# Patient Record
Sex: Female | Born: 1935 | Race: White | Hispanic: No | Marital: Married | Smoking: Former smoker
Health system: Southern US, Community
[De-identification: ages and names within clinical notes are randomized; demographics above are authoritative.]

## PROBLEM LIST (undated history)

## (undated) DIAGNOSIS — Z923 Personal history of irradiation: Secondary | ICD-10-CM

## (undated) DIAGNOSIS — C50919 Malignant neoplasm of unspecified site of unspecified female breast: Secondary | ICD-10-CM

## (undated) DIAGNOSIS — R519 Headache, unspecified: Secondary | ICD-10-CM

## (undated) DIAGNOSIS — R51 Headache: Secondary | ICD-10-CM

## (undated) DIAGNOSIS — I639 Cerebral infarction, unspecified: Secondary | ICD-10-CM

## (undated) DIAGNOSIS — K589 Irritable bowel syndrome without diarrhea: Secondary | ICD-10-CM

## (undated) DIAGNOSIS — C801 Malignant (primary) neoplasm, unspecified: Secondary | ICD-10-CM

## (undated) DIAGNOSIS — I4891 Unspecified atrial fibrillation: Secondary | ICD-10-CM

## (undated) DIAGNOSIS — E039 Hypothyroidism, unspecified: Secondary | ICD-10-CM

---

## 2005-03-15 ENCOUNTER — Ambulatory Visit: Payer: Self-pay | Admitting: Internal Medicine

## 2006-08-07 ENCOUNTER — Ambulatory Visit: Payer: Self-pay | Admitting: Internal Medicine

## 2006-08-15 ENCOUNTER — Ambulatory Visit: Payer: Self-pay | Admitting: Internal Medicine

## 2006-08-27 ENCOUNTER — Ambulatory Visit: Payer: Self-pay | Admitting: Internal Medicine

## 2006-09-07 HISTORY — PX: BREAST BIOPSY: SHX20

## 2006-10-03 ENCOUNTER — Ambulatory Visit: Payer: Self-pay | Admitting: Surgery

## 2006-10-08 DIAGNOSIS — C50919 Malignant neoplasm of unspecified site of unspecified female breast: Secondary | ICD-10-CM

## 2006-10-08 HISTORY — DX: Malignant neoplasm of unspecified site of unspecified female breast: C50.919

## 2006-10-08 HISTORY — PX: BREAST LUMPECTOMY: SHX2

## 2006-10-17 ENCOUNTER — Other Ambulatory Visit: Payer: Self-pay

## 2006-10-17 ENCOUNTER — Ambulatory Visit: Payer: Self-pay | Admitting: Surgery

## 2006-10-21 ENCOUNTER — Ambulatory Visit: Payer: Self-pay | Admitting: Surgery

## 2006-11-21 ENCOUNTER — Ambulatory Visit: Payer: Self-pay | Admitting: Oncology

## 2006-12-16 ENCOUNTER — Ambulatory Visit: Payer: Self-pay | Admitting: Surgery

## 2006-12-25 ENCOUNTER — Ambulatory Visit: Payer: Self-pay | Admitting: Oncology

## 2006-12-26 ENCOUNTER — Ambulatory Visit: Payer: Self-pay | Admitting: Oncology

## 2007-01-07 ENCOUNTER — Ambulatory Visit: Payer: Self-pay | Admitting: Oncology

## 2007-01-10 ENCOUNTER — Ambulatory Visit: Payer: Self-pay | Admitting: Oncology

## 2007-02-06 ENCOUNTER — Ambulatory Visit: Payer: Self-pay | Admitting: Oncology

## 2007-03-09 ENCOUNTER — Ambulatory Visit: Payer: Self-pay | Admitting: Oncology

## 2007-04-08 ENCOUNTER — Ambulatory Visit: Payer: Self-pay | Admitting: Oncology

## 2007-05-09 ENCOUNTER — Ambulatory Visit: Payer: Self-pay | Admitting: Oncology

## 2007-05-19 ENCOUNTER — Ambulatory Visit: Payer: Self-pay | Admitting: Internal Medicine

## 2007-09-08 ENCOUNTER — Ambulatory Visit: Payer: Self-pay | Admitting: Oncology

## 2007-09-08 ENCOUNTER — Ambulatory Visit: Payer: Self-pay | Admitting: Radiation Oncology

## 2007-10-06 ENCOUNTER — Ambulatory Visit: Payer: Self-pay | Admitting: Surgery

## 2007-10-09 ENCOUNTER — Ambulatory Visit: Payer: Self-pay | Admitting: Radiation Oncology

## 2007-10-17 ENCOUNTER — Ambulatory Visit: Payer: Self-pay | Admitting: Oncology

## 2007-11-09 ENCOUNTER — Ambulatory Visit: Payer: Self-pay | Admitting: Radiation Oncology

## 2007-11-09 ENCOUNTER — Ambulatory Visit: Payer: Self-pay | Admitting: Oncology

## 2008-04-07 ENCOUNTER — Ambulatory Visit: Payer: Self-pay | Admitting: Oncology

## 2008-04-30 ENCOUNTER — Ambulatory Visit: Payer: Self-pay | Admitting: Oncology

## 2008-05-08 ENCOUNTER — Ambulatory Visit: Payer: Self-pay | Admitting: Oncology

## 2008-07-22 ENCOUNTER — Ambulatory Visit: Payer: Self-pay | Admitting: Gastroenterology

## 2008-08-31 ENCOUNTER — Ambulatory Visit: Payer: Self-pay | Admitting: Internal Medicine

## 2008-09-08 ENCOUNTER — Ambulatory Visit: Payer: Self-pay | Admitting: Internal Medicine

## 2008-10-06 ENCOUNTER — Ambulatory Visit: Payer: Self-pay | Admitting: Internal Medicine

## 2008-11-08 ENCOUNTER — Ambulatory Visit: Payer: Self-pay | Admitting: Oncology

## 2008-11-11 ENCOUNTER — Ambulatory Visit: Payer: Self-pay | Admitting: Oncology

## 2008-12-06 ENCOUNTER — Ambulatory Visit: Payer: Self-pay | Admitting: Oncology

## 2009-03-21 ENCOUNTER — Ambulatory Visit: Payer: Self-pay | Admitting: Internal Medicine

## 2009-05-08 ENCOUNTER — Ambulatory Visit: Payer: Self-pay | Admitting: Oncology

## 2009-05-11 ENCOUNTER — Ambulatory Visit: Payer: Self-pay | Admitting: Oncology

## 2009-06-08 ENCOUNTER — Ambulatory Visit: Payer: Self-pay | Admitting: Oncology

## 2009-07-08 ENCOUNTER — Ambulatory Visit: Payer: Self-pay | Admitting: Internal Medicine

## 2009-09-19 ENCOUNTER — Ambulatory Visit: Payer: Self-pay | Admitting: Internal Medicine

## 2009-10-10 ENCOUNTER — Ambulatory Visit: Payer: Self-pay | Admitting: Oncology

## 2009-11-08 ENCOUNTER — Ambulatory Visit: Payer: Self-pay | Admitting: Oncology

## 2009-11-10 ENCOUNTER — Ambulatory Visit: Payer: Self-pay | Admitting: Oncology

## 2009-12-06 ENCOUNTER — Ambulatory Visit: Payer: Self-pay | Admitting: Oncology

## 2010-03-21 ENCOUNTER — Ambulatory Visit: Payer: Self-pay | Admitting: Internal Medicine

## 2010-05-08 ENCOUNTER — Ambulatory Visit: Payer: Self-pay | Admitting: Oncology

## 2010-05-11 ENCOUNTER — Ambulatory Visit: Payer: Self-pay | Admitting: Oncology

## 2010-05-12 LAB — CANCER ANTIGEN 27.29: CA 27.29: 35.7 U/mL (ref 0.0–38.6)

## 2010-06-08 ENCOUNTER — Ambulatory Visit: Payer: Self-pay | Admitting: Oncology

## 2010-10-18 ENCOUNTER — Ambulatory Visit: Payer: Self-pay | Admitting: Oncology

## 2010-12-11 ENCOUNTER — Ambulatory Visit: Payer: Self-pay | Admitting: Oncology

## 2011-01-07 ENCOUNTER — Ambulatory Visit: Payer: Self-pay | Admitting: Oncology

## 2011-06-02 ENCOUNTER — Emergency Department: Payer: Self-pay | Admitting: Internal Medicine

## 2011-06-13 ENCOUNTER — Ambulatory Visit: Payer: Self-pay | Admitting: Physician Assistant

## 2011-06-19 ENCOUNTER — Ambulatory Visit: Payer: Self-pay | Admitting: Oncology

## 2011-06-20 LAB — CANCER ANTIGEN 27.29: CA 27.29: 24 U/mL (ref 0.0–38.6)

## 2011-06-26 ENCOUNTER — Ambulatory Visit: Payer: Self-pay | Admitting: Internal Medicine

## 2011-07-09 ENCOUNTER — Ambulatory Visit: Payer: Self-pay | Admitting: Oncology

## 2011-07-09 ENCOUNTER — Encounter: Payer: Self-pay | Admitting: Neurology

## 2011-09-17 ENCOUNTER — Inpatient Hospital Stay: Payer: Self-pay | Admitting: Internal Medicine

## 2011-10-04 ENCOUNTER — Ambulatory Visit: Payer: Self-pay | Admitting: Neurology

## 2011-10-22 ENCOUNTER — Ambulatory Visit: Payer: Self-pay | Admitting: Oncology

## 2011-12-27 ENCOUNTER — Ambulatory Visit: Payer: Self-pay | Admitting: Oncology

## 2011-12-27 LAB — COMPREHENSIVE METABOLIC PANEL
Albumin: 3.6 g/dL (ref 3.4–5.0)
Alkaline Phosphatase: 120 U/L (ref 50–136)
Calcium, Total: 8.4 mg/dL — ABNORMAL LOW (ref 8.5–10.1)
Chloride: 107 mmol/L (ref 98–107)
Co2: 31 mmol/L (ref 21–32)
Creatinine: 0.91 mg/dL (ref 0.60–1.30)
EGFR (African American): >60
EGFR (Non-African Amer.): >60
Glucose: 109 mg/dL — ABNORMAL HIGH (ref 65–99)
SGOT(AST): 23 U/L (ref 15–37)
SGPT (ALT): 28 U/L

## 2011-12-27 LAB — CBC CANCER CENTER
Basophil #: 0 x10 3/mm (ref 0.0–0.1)
Basophil %: 0.4 %
Eosinophil %: 0.9 %
HCT: 39.7 % (ref 35.0–47.0)
HGB: 13.6 g/dL (ref 12.0–16.0)
Lymphocyte #: 0.8 x10 3/mm — ABNORMAL LOW (ref 1.0–3.6)
Lymphocyte %: 14.4 %
MCH: 30 pg (ref 26.0–34.0)
MCHC: 34.2 g/dL (ref 32.0–36.0)
MCV: 88 fL (ref 80–100)
Monocyte #: 0.3 x10 3/mm (ref 0.0–0.7)
Monocyte %: 5.3 %
Neutrophil #: 4.1 x10 3/mm (ref 1.4–6.5)
RBC: 4.52 10*6/uL (ref 3.80–5.20)
RDW: 13.7 % (ref 11.5–14.5)
WBC: 5.2 x10 3/mm (ref 3.6–11.0)

## 2011-12-28 LAB — CANCER ANTIGEN 27.29: CA 27.29: 26.7 U/mL (ref 0.0–38.6)

## 2012-01-07 ENCOUNTER — Ambulatory Visit: Payer: Self-pay | Admitting: Oncology

## 2012-04-01 ENCOUNTER — Ambulatory Visit: Payer: Self-pay | Admitting: Gastroenterology

## 2012-04-01 HISTORY — PX: COLONOSCOPY: SHX174

## 2012-10-23 ENCOUNTER — Ambulatory Visit: Payer: Self-pay | Admitting: Oncology

## 2012-12-24 ENCOUNTER — Ambulatory Visit: Payer: Self-pay | Admitting: Oncology

## 2012-12-29 LAB — CBC CANCER CENTER
Basophil #: 0 x10 3/mm (ref 0.0–0.1)
Basophil %: 0.5 %
Eosinophil #: 0.1 x10 3/mm (ref 0.0–0.7)
HCT: 39.3 % (ref 35.0–47.0)
HGB: 13.6 g/dL (ref 12.0–16.0)
Lymphocyte #: 0.6 x10 3/mm — ABNORMAL LOW (ref 1.0–3.6)
Lymphocyte %: 12.6 %
MCH: 30.2 pg (ref 26.0–34.0)
MCV: 87 fL (ref 80–100)
Monocyte #: 0.3 x10 3/mm (ref 0.2–0.9)
Neutrophil #: 4.1 x10 3/mm (ref 1.4–6.5)
Platelet: 181 x10 3/mm (ref 150–440)
RBC: 4.5 10*6/uL (ref 3.80–5.20)
WBC: 5.1 x10 3/mm (ref 3.6–11.0)

## 2012-12-29 LAB — COMPREHENSIVE METABOLIC PANEL
Albumin: 3.4 g/dL (ref 3.4–5.0)
Alkaline Phosphatase: 128 U/L (ref 50–136)
Calcium, Total: 9 mg/dL (ref 8.5–10.1)
Creatinine: 0.95 mg/dL (ref 0.60–1.30)
EGFR (African American): >60
Glucose: 90 mg/dL (ref 65–99)
Osmolality: 285 (ref 275–301)
Potassium: 4.1 mmol/L (ref 3.5–5.1)
SGOT(AST): 16 U/L (ref 15–37)
Total Protein: 6.6 g/dL (ref 6.4–8.2)

## 2012-12-30 LAB — CANCER ANTIGEN 27.29: CA 27.29: 21.6 U/mL (ref 0.0–38.6)

## 2013-01-06 ENCOUNTER — Ambulatory Visit: Payer: Self-pay | Admitting: Oncology

## 2013-07-28 ENCOUNTER — Ambulatory Visit: Payer: Self-pay | Admitting: Internal Medicine

## 2013-10-26 ENCOUNTER — Ambulatory Visit: Payer: Self-pay | Admitting: Oncology

## 2013-12-25 ENCOUNTER — Ambulatory Visit: Payer: Self-pay | Admitting: Oncology

## 2013-12-28 LAB — COMPREHENSIVE METABOLIC PANEL
Albumin: 3.4 g/dL (ref 3.4–5.0)
Alkaline Phosphatase: 137 U/L — ABNORMAL HIGH
Anion Gap: 7 (ref 7–16)
BILIRUBIN TOTAL: 0.5 mg/dL (ref 0.2–1.0)
BUN: 15 mg/dL (ref 7–18)
CHLORIDE: 107 mmol/L (ref 98–107)
Calcium, Total: 8.8 mg/dL (ref 8.5–10.1)
Co2: 31 mmol/L (ref 21–32)
Creatinine: 0.85 mg/dL (ref 0.60–1.30)
EGFR (African American): >60
Glucose: 103 mg/dL — ABNORMAL HIGH (ref 65–99)
Osmolality: 290 (ref 275–301)
Potassium: 3.3 mmol/L — ABNORMAL LOW (ref 3.5–5.1)
SGOT(AST): 23 U/L (ref 15–37)
SGPT (ALT): 23 U/L (ref 12–78)
SODIUM: 145 mmol/L (ref 136–145)
Total Protein: 6.5 g/dL (ref 6.4–8.2)

## 2013-12-28 LAB — CBC CANCER CENTER
BASOS PCT: 0.5 %
Basophil #: 0 x10 3/mm (ref 0.0–0.1)
EOS ABS: 0.1 x10 3/mm (ref 0.0–0.7)
EOS PCT: 1.2 %
HCT: 41 % (ref 35.0–47.0)
HGB: 13.7 g/dL (ref 12.0–16.0)
Lymphocyte #: 0.7 x10 3/mm — ABNORMAL LOW (ref 1.0–3.6)
Lymphocyte %: 14.2 %
MCH: 29.5 pg (ref 26.0–34.0)
MCHC: 33.3 g/dL (ref 32.0–36.0)
MCV: 89 fL (ref 80–100)
Monocyte #: 0.2 x10 3/mm (ref 0.2–0.9)
Monocyte %: 4.6 %
NEUTROS ABS: 4.2 x10 3/mm (ref 1.4–6.5)
Neutrophil %: 79.5 %
Platelet: 205 x10 3/mm (ref 150–440)
RBC: 4.64 10*6/uL (ref 3.80–5.20)
RDW: 13.7 % (ref 11.5–14.5)
WBC: 5.2 x10 3/mm (ref 3.6–11.0)

## 2013-12-29 LAB — CANCER ANTIGEN 27.29: CA 27.29: 27.2 U/mL (ref 0.0–38.6)

## 2014-01-06 ENCOUNTER — Ambulatory Visit: Payer: Self-pay | Admitting: Oncology

## 2014-10-27 ENCOUNTER — Ambulatory Visit: Payer: Self-pay | Admitting: Internal Medicine

## 2016-09-12 ENCOUNTER — Other Ambulatory Visit: Payer: Self-pay | Admitting: Internal Medicine

## 2016-09-12 DIAGNOSIS — Z1231 Encounter for screening mammogram for malignant neoplasm of breast: Secondary | ICD-10-CM

## 2016-10-18 ENCOUNTER — Ambulatory Visit
Admission: RE | Admit: 2016-10-18 | Discharge: 2016-10-18 | Disposition: A | Discharge status: Home / Self Care | Payer: Medicare Other | Source: Ambulatory Visit | Attending: Internal Medicine | Admitting: Internal Medicine

## 2016-10-18 DIAGNOSIS — Z1231 Encounter for screening mammogram for malignant neoplasm of breast: Secondary | ICD-10-CM | POA: Insufficient documentation

## 2016-10-18 HISTORY — DX: Malignant (primary) neoplasm, unspecified: C80.1

## 2017-02-06 ENCOUNTER — Other Ambulatory Visit: Payer: Self-pay | Admitting: Internal Medicine

## 2017-02-06 DIAGNOSIS — R519 Headache, unspecified: Secondary | ICD-10-CM

## 2017-02-06 DIAGNOSIS — R51 Headache: Principal | ICD-10-CM

## 2017-02-15 ENCOUNTER — Ambulatory Visit
Admission: RE | Admit: 2017-02-15 | Discharge: 2017-02-15 | Disposition: A | Discharge status: Home / Self Care | Payer: Medicare Other | Source: Ambulatory Visit | Attending: Internal Medicine | Admitting: Internal Medicine

## 2017-02-15 DIAGNOSIS — R9082 White matter disease, unspecified: Secondary | ICD-10-CM | POA: Diagnosis not present

## 2017-02-15 DIAGNOSIS — R600 Localized edema: Secondary | ICD-10-CM | POA: Insufficient documentation

## 2017-02-15 DIAGNOSIS — R51 Headache: Secondary | ICD-10-CM | POA: Diagnosis not present

## 2017-02-15 DIAGNOSIS — R519 Headache, unspecified: Secondary | ICD-10-CM

## 2017-04-26 ENCOUNTER — Other Ambulatory Visit: Payer: Self-pay | Admitting: Neurology

## 2017-04-26 DIAGNOSIS — R519 Headache, unspecified: Secondary | ICD-10-CM

## 2017-04-26 DIAGNOSIS — R51 Headache: Principal | ICD-10-CM

## 2017-05-02 ENCOUNTER — Ambulatory Visit
Admission: RE | Admit: 2017-05-02 | Discharge: 2017-05-02 | Disposition: A | Discharge status: Home / Self Care | Payer: Medicare Other | Source: Ambulatory Visit | Attending: Neurology | Admitting: Neurology

## 2017-05-02 DIAGNOSIS — R51 Headache: Secondary | ICD-10-CM | POA: Diagnosis present

## 2017-05-02 DIAGNOSIS — G319 Degenerative disease of nervous system, unspecified: Secondary | ICD-10-CM | POA: Insufficient documentation

## 2017-05-02 DIAGNOSIS — I739 Peripheral vascular disease, unspecified: Secondary | ICD-10-CM | POA: Diagnosis not present

## 2017-05-02 DIAGNOSIS — R519 Headache, unspecified: Secondary | ICD-10-CM

## 2017-09-23 ENCOUNTER — Other Ambulatory Visit: Payer: Self-pay | Admitting: Internal Medicine

## 2017-09-23 DIAGNOSIS — Z1231 Encounter for screening mammogram for malignant neoplasm of breast: Secondary | ICD-10-CM

## 2017-10-23 ENCOUNTER — Ambulatory Visit
Admission: RE | Admit: 2017-10-23 | Discharge: 2017-10-23 | Disposition: A | Discharge status: Home / Self Care | Payer: Medicare Other | Source: Ambulatory Visit | Attending: Internal Medicine | Admitting: Internal Medicine

## 2017-10-23 DIAGNOSIS — Z1231 Encounter for screening mammogram for malignant neoplasm of breast: Secondary | ICD-10-CM | POA: Insufficient documentation

## 2017-10-23 HISTORY — DX: Malignant neoplasm of unspecified site of unspecified female breast: C50.919

## 2017-10-25 ENCOUNTER — Other Ambulatory Visit: Payer: Self-pay | Admitting: Internal Medicine

## 2017-10-25 DIAGNOSIS — R928 Other abnormal and inconclusive findings on diagnostic imaging of breast: Secondary | ICD-10-CM

## 2017-10-25 DIAGNOSIS — N6310 Unspecified lump in the right breast, unspecified quadrant: Secondary | ICD-10-CM

## 2017-10-31 ENCOUNTER — Ambulatory Visit
Admission: RE | Admit: 2017-10-31 | Discharge: 2017-10-31 | Disposition: A | Discharge status: Home / Self Care | Payer: Medicare Other | Source: Ambulatory Visit | Attending: Internal Medicine | Admitting: Internal Medicine

## 2017-10-31 DIAGNOSIS — N6310 Unspecified lump in the right breast, unspecified quadrant: Secondary | ICD-10-CM | POA: Diagnosis not present

## 2017-10-31 DIAGNOSIS — R928 Other abnormal and inconclusive findings on diagnostic imaging of breast: Secondary | ICD-10-CM | POA: Insufficient documentation

## 2017-12-18 ENCOUNTER — Telehealth: Payer: Self-pay

## 2017-12-18 NOTE — Telephone Encounter (Signed)
Gastroenterology Pre-Procedure Review  Request Date: 01/03/18  Requesting Physician: Dr. Allen Norris   PATIENT REVIEW QUESTIONS: The patient responded to the following health history questions as indicated:    1. Are you having any GI issues? No  2. Do you have a personal history of Polyps? Yes  3. Do you have a family history of Colon Cancer or Polyps? No  4. Diabetes Mellitus? No  5. Joint replacements in the past 12 months? No  6. Major health problems in the past 3 months? no 7. Any artificial heart valves, MVP, or defibrillator? No     MEDICATIONS & ALLERGIES:    Patient reports the following regarding taking any anticoagulation/antiplatelet therapy:   Plavix, Coumadin, Eliquis, Xarelto, Lovenox, Pradaxa, Brilinta, or Effient? Yes, Eliquis  Aspirin? No   Patient confirms/reports the following medications:  Current Outpatient Medications  Medication Sig Dispense Refill  . apixaban (ELIQUIS) 2.5 MG TABS tablet TAKE 1 TABLET BY MOUTH TWICE DAILY    . Cholecalciferol (D-5000) 5000 units TABS Take by mouth.    Marland Kitchen ibuprofen (ADVIL,MOTRIN) 200 MG tablet Take by mouth.    . levothyroxine (SYNTHROID, LEVOTHROID) 50 MCG tablet TAKE 1 TABLET BY MOUTH ONCE DAILY ON AN EMPTY STOMACH. WAIT 30 MINUTES BEFORE TAKING OTHER MEDS.    Marland Kitchen lisinopril (PRINIVIL,ZESTRIL) 20 MG tablet TAKE 1 TABLET BY MOUTH ONCE DAILY    . Magnesium Carbonate (MAGNESIUM GLUCONATE) 54mg /29ml syringe Take by mouth.    Marland Kitchen omeprazole (PRILOSEC) 20 MG capsule TAKE 1 CAPSULE BY MOUTH TWICE DAILY    . Probiotic Product (PROBIOTIC-10) CAPS Take 1 capsule by mouth.    . temazepam (RESTORIL) 30 MG capsule TAKE 1 CAPSULE BY MOUTH AT BEDTIME AS NEEDED FOR SLEEP    . vitamin B-12 (CYANOCOBALAMIN) 1000 MCG tablet Take by mouth.     No current facility-administered medications for this visit.     Patient confirms/reports the following allergies:  No Known Allergies  No orders of the defined types were placed in this  encounter.   AUTHORIZATION INFORMATION Primary Insurance: 1D#: Group #:  Secondary Insurance: 1D#: Group #:  SCHEDULE INFORMATION: Date: 01/03/18  Time: Location: [place]

## 2017-12-23 ENCOUNTER — Other Ambulatory Visit: Payer: Self-pay

## 2017-12-23 DIAGNOSIS — Z1211 Encounter for screening for malignant neoplasm of colon: Secondary | ICD-10-CM

## 2017-12-26 ENCOUNTER — Other Ambulatory Visit: Payer: Self-pay

## 2017-12-26 MED ORDER — NA SULFATE-K SULFATE-MG SULF 17.5-3.13-1.6 GM/177ML PO SOLN: 1.0000 | Freq: Once | ORAL | 0 refills | Status: AC

## 2018-01-02 NOTE — Discharge Instructions (Signed)
General Anesthesia, Adult, Care After °These instructions provide you with information about caring for yourself after your procedure. Your health care provider may also give you more specific instructions. Your treatment has been planned according to current medical practices, but problems sometimes occur. Call your health care provider if you have any problems or questions after your procedure. °What can I expect after the procedure? °After the procedure, it is common to have: °· Vomiting. °· A sore throat. °· Mental slowness. ° °It is common to feel: °· Nauseous. °· Cold or shivery. °· Sleepy. °· Tired. °· Sore or achy, even in parts of your body where you did not have surgery. ° °Follow these instructions at home: °For at least 24 hours after the procedure: °· Do not: °? Participate in activities where you could fall or become injured. °? Drive. °? Use heavy machinery. °? Drink alcohol. °? Take sleeping pills or medicines that cause drowsiness. °? Make important decisions or sign legal documents. °? Take care of children on your own. °· Rest. °Eating and drinking °· If you vomit, drink water, juice, or soup when you can drink without vomiting. °· Drink enough fluid to keep your urine clear or pale yellow. °· Make sure you have little or no nausea before eating solid foods. °· Follow the diet recommended by your health care provider. °General instructions °· Have a responsible adult stay with you until you are awake and alert. °· Return to your normal activities as told by your health care provider. Ask your health care provider what activities are safe for you. °· Take over-the-counter and prescription medicines only as told by your health care provider. °· If you smoke, do not smoke without supervision. °· Keep all follow-up visits as told by your health care provider. This is important. °Contact a health care provider if: °· You continue to have nausea or vomiting at home, and medicines are not helpful. °· You  cannot drink fluids or start eating again. °· You cannot urinate after 8-12 hours. °· You develop a skin rash. °· You have fever. °· You have increasing redness at the site of your procedure. °Get help right away if: °· You have difficulty breathing. °· You have chest pain. °· You have unexpected bleeding. °· You feel that you are having a life-threatening or urgent problem. °This information is not intended to replace advice given to you by your health care provider. Make sure you discuss any questions you have with your health care provider. °Document Released: 12/31/2000 Document Revised: 02/27/2016 Document Reviewed: 09/08/2015 °Elsevier Interactive Patient Education © 2018 [employer] ° °

## 2018-01-03 ENCOUNTER — Encounter
Admission: RE | Disposition: A | Discharge status: Home / Self Care | Payer: Self-pay | Source: Ambulatory Visit | Attending: Gastroenterology

## 2018-01-03 ENCOUNTER — Ambulatory Visit: Payer: Medicare Other | Admitting: Anesthesiology

## 2018-01-03 ENCOUNTER — Ambulatory Visit
Admission: RE | Admit: 2018-01-03 | Discharge: 2018-01-03 | Disposition: A | Discharge status: Home / Self Care | Payer: Medicare Other | Source: Ambulatory Visit | Attending: Gastroenterology | Admitting: Gastroenterology

## 2018-01-03 DIAGNOSIS — I4891 Unspecified atrial fibrillation: Secondary | ICD-10-CM | POA: Insufficient documentation

## 2018-01-03 DIAGNOSIS — Z8673 Personal history of transient ischemic attack (TIA), and cerebral infarction without residual deficits: Secondary | ICD-10-CM | POA: Insufficient documentation

## 2018-01-03 DIAGNOSIS — K5730 Diverticulosis of large intestine without perforation or abscess without bleeding: Secondary | ICD-10-CM | POA: Diagnosis not present

## 2018-01-03 DIAGNOSIS — Z87891 Personal history of nicotine dependence: Secondary | ICD-10-CM | POA: Insufficient documentation

## 2018-01-03 DIAGNOSIS — K635 Polyp of colon: Secondary | ICD-10-CM | POA: Insufficient documentation

## 2018-01-03 DIAGNOSIS — E039 Hypothyroidism, unspecified: Secondary | ICD-10-CM | POA: Insufficient documentation

## 2018-01-03 DIAGNOSIS — D127 Benign neoplasm of rectosigmoid junction: Secondary | ICD-10-CM | POA: Diagnosis not present

## 2018-01-03 DIAGNOSIS — Z1211 Encounter for screening for malignant neoplasm of colon: Secondary | ICD-10-CM | POA: Diagnosis present

## 2018-01-03 DIAGNOSIS — Z860100 Personal history of colon polyps, unspecified: Secondary | ICD-10-CM

## 2018-01-03 DIAGNOSIS — Z79890 Hormone replacement therapy (postmenopausal): Secondary | ICD-10-CM | POA: Diagnosis not present

## 2018-01-03 DIAGNOSIS — Z853 Personal history of malignant neoplasm of breast: Secondary | ICD-10-CM | POA: Diagnosis not present

## 2018-01-03 DIAGNOSIS — Z79899 Other long term (current) drug therapy: Secondary | ICD-10-CM | POA: Insufficient documentation

## 2018-01-03 DIAGNOSIS — Z7901 Long term (current) use of anticoagulants: Secondary | ICD-10-CM | POA: Insufficient documentation

## 2018-01-03 HISTORY — DX: Headache: R51

## 2018-01-03 HISTORY — DX: Irritable bowel syndrome, unspecified: K58.9

## 2018-01-03 HISTORY — DX: Unspecified atrial fibrillation: I48.91

## 2018-01-03 HISTORY — DX: Cerebral infarction, unspecified: I63.9

## 2018-01-03 HISTORY — DX: Headache, unspecified: R51.9

## 2018-01-03 HISTORY — DX: Hypothyroidism, unspecified: E03.9

## 2018-01-03 HISTORY — PX: COLONOSCOPY WITH PROPOFOL: SHX5780

## 2018-01-03 SURGERY — COLONOSCOPY WITH PROPOFOL
Anesthesia: General | Wound class: Contaminated

## 2018-01-03 MED ORDER — PROPOFOL 10 MG/ML IV BOLUS
INTRAVENOUS | Status: DC | PRN
  Administered 2018-01-03: 10 mg via INTRAVENOUS
  Administered 2018-01-03: 60 mg via INTRAVENOUS
  Administered 2018-01-03: 10 mg via INTRAVENOUS
  Administered 2018-01-03: 20 mg via INTRAVENOUS

## 2018-01-03 MED ORDER — LACTATED RINGERS IV SOLN
1000.0000 mL | INTRAVENOUS | Status: DC
  Administered 2018-01-03: 1000 mL via INTRAVENOUS
  Administered 2018-01-03: 10:00:00 via INTRAVENOUS

## 2018-01-03 MED ORDER — LIDOCAINE HCL (CARDIAC) 20 MG/ML IV SOLN
INTRAVENOUS | Status: DC | PRN
  Administered 2018-01-03: 25 mg via INTRAVENOUS

## 2018-01-03 MED ORDER — SODIUM CHLORIDE 0.9 % IV SOLN: INTRAVENOUS | Status: DC

## 2018-01-03 SURGICAL SUPPLY — 24 items
CANISTER SUCT 1200ML W/VALVE (MISCELLANEOUS) ×3 IMPLANT
CLIP HMST 235XBRD CATH ROT (MISCELLANEOUS) IMPLANT
CLIP RESOLUTION 360 11X235 (MISCELLANEOUS)
ELECT REM PT RETURN 9FT ADLT (ELECTROSURGICAL)
ELECTRODE REM PT RTRN 9FT ADLT (ELECTROSURGICAL) IMPLANT
FCP ESCP3.2XJMB 240X2.8X (MISCELLANEOUS)
FORCEPS BIOP RAD 4 LRG CAP 4 (CUTTING FORCEPS) ×3 IMPLANT
FORCEPS BIOP RJ4 240 W/NDL (MISCELLANEOUS)
FORCEPS ESCP3.2XJMB 240X2.8X (MISCELLANEOUS) IMPLANT
GOWN CVR UNV OPN BCK APRN NK (MISCELLANEOUS) ×2 IMPLANT
GOWN ISOL THUMB LOOP REG UNIV (MISCELLANEOUS) ×4
INJECTOR VARIJECT VIN23 (MISCELLANEOUS) IMPLANT
KIT DEFENDO VALVE AND CONN (KITS) IMPLANT
KIT ENDO PROCEDURE OLY (KITS) ×3 IMPLANT
MARKER SPOT ENDO TATTOO 5ML (MISCELLANEOUS) IMPLANT
PROBE APC STR FIRE (PROBE) IMPLANT
RETRIEVER NET ROTH 2.5X230 LF (MISCELLANEOUS) IMPLANT
SNARE SHORT THROW 13M SML OVAL (MISCELLANEOUS) IMPLANT
SNARE SHORT THROW 30M LRG OVAL (MISCELLANEOUS) IMPLANT
SNARE SNG USE RND 15MM (INSTRUMENTS) IMPLANT
SPOT EX ENDOSCOPIC TATTOO (MISCELLANEOUS)
TRAP ETRAP POLY (MISCELLANEOUS) IMPLANT
VARIJECT INJECTOR VIN23 (MISCELLANEOUS)
WATER STERILE IRR 250ML POUR (IV SOLUTION) ×3 IMPLANT

## 2018-01-03 NOTE — Transfer of Care (Signed)
Immediate Anesthesia Transfer of Care Note  Patient: Mackenzie [name]  Procedure(s) Performed: COLONOSCOPY WITH PROPOFOL (N/A )  Patient Location: PACU  Anesthesia Type: General  Level of Consciousness: awake, alert  and patient cooperative  Airway and Oxygen Therapy: Patient Spontanous Breathing and Patient connected to supplemental oxygen  Post-op Assessment: Post-op Vital signs reviewed, Patient's Cardiovascular Status Stable, Respiratory Function Stable, Patent Airway and No signs of Nausea or vomiting  Post-op Vital Signs: Reviewed and stable  Complications: No apparent anesthesia complications

## 2018-01-03 NOTE — Anesthesia Postprocedure Evaluation (Signed)
Anesthesia Post Note  Patient: Mackenzie [name]  Procedure(s) Performed: COLONOSCOPY WITH PROPOFOL (N/A )  Patient location during evaluation: PACU Anesthesia Type: General Level of consciousness: awake Pain management: pain level controlled Vital Signs Assessment: post-procedure vital signs reviewed and stable Respiratory status: spontaneous breathing Cardiovascular status: blood pressure returned to baseline Postop Assessment: no headache Anesthetic complications: no    Lavonna Monarch

## 2018-01-03 NOTE — H&P (Signed)
Lucilla Lame, MD [facility] [address] [place], [place] [address] Phone:[phone] Fax : [phone]  Primary Care Physician:  Adin Hector, MD Primary Gastroenterologist:  Dr. Allen Norris  Pre-Procedure History & Physical: HPI:  Mackenzie [name] is a 84 y.o. female is here for an colonoscopy.   Past Medical History:  Diagnosis Date  . Atrial fibrillation ([place])   . Breast cancer ([place]) 10/2006  . Cancer [facility])    Breast cancer rt breast 10 years ago  . Headache    treats with magnesium  . Hypothyroidism   . IBS (irritable bowel syndrome)   . Stroke [facility])    no residual deficits    Past Surgical History:  Procedure Laterality Date  . BREAST BIOPSY Right 09/2006   positive  . BREAST LUMPECTOMY Right 10/2006   f/u with radiation and tamoxifen   . COLONOSCOPY  04/01/2012   Dr Candace Cruise    Prior to Admission medications   Medication Sig Start Date End Date Taking? Authorizing Provider  apixaban (ELIQUIS) 2.5 MG TABS tablet TAKE 1 TABLET BY MOUTH TWICE DAILY 07/19/17  Yes [provider]  Cholecalciferol (D-5000) 5000 units TABS Take by mouth.   Yes [provider]  ibuprofen (ADVIL,MOTRIN) 200 MG tablet Take by mouth.   Yes [provider]  levothyroxine (SYNTHROID, LEVOTHROID) 50 MCG tablet TAKE 1 TABLET BY MOUTH ONCE DAILY ON AN EMPTY STOMACH. WAIT 30 MINUTES BEFORE TAKING OTHER MEDS. 08/13/17  Yes [provider]  lisinopril (PRINIVIL,ZESTRIL) 20 MG tablet TAKE 1 TABLET BY MOUTH ONCE DAILY 10/21/17  Yes [provider]  Magnesium Carbonate (MAGNESIUM GLUCONATE) 54mg /72ml syringe Take by mouth.   Yes [provider]  omeprazole (PRILOSEC) 20 MG capsule TAKE 1 CAPSULE BY MOUTH TWICE DAILY 01/25/17  Yes [provider]  Probiotic Product (PROBIOTIC-10) CAPS Take 1 capsule by mouth.   Yes [provider]  temazepam (RESTORIL) 30 MG capsule TAKE 1 CAPSULE BY MOUTH AT BEDTIME AS NEEDED FOR SLEEP 04/29/17  Yes  [provider]  vitamin B-12 (CYANOCOBALAMIN) 1000 MCG tablet Take by mouth.   Yes [provider]    Allergies as of 12/23/2017  . (No Known Allergies)    Family History  Problem Relation Age of Onset  . Brain cancer Father   . Breast cancer Paternal Aunt     Social History   Socioeconomic History  . Marital status: Married    Spouse name: Not on file  . Number of children: Not on file  . Years of education: Not on file  . Highest education level: Not on file  Occupational History  . Not on file  Social Needs  . Financial resource strain: Not on file  . Food insecurity:    Worry: Not on file    Inability: Not on file  . Transportation needs:    Medical: Not on file    Non-medical: Not on file  Tobacco Use  . Smoking status: Former Research scientist (life sciences)  . Smokeless tobacco: Never Used  Substance and Sexual Activity  . Alcohol use: Not on file  . Drug use: Not on file  . Sexual activity: Not on file  Lifestyle  . Physical activity:    Days per week: Not on file    Minutes per session: Not on file  . Stress: Not on file  Relationships  . Social connections:    Talks on phone: Not on file    Gets together: Not on file  Attends religious service: Not on file    Active member of club or organization: Not on file    Attends meetings of clubs or organizations: Not on file    Relationship status: Not on file  . Intimate partner violence:    Fear of current or ex partner: Not on file    Emotionally abused: Not on file    Physically abused: Not on file    Forced sexual activity: Not on file  Other Topics Concern  . Not on file  Social History Narrative  . Not on file    Review of Systems: See HPI, otherwise negative ROS  Physical Exam: BP (!) 188/69   Pulse (!) 56   Temp 97.9 F (36.6 C) (Temporal)   Resp 16   Ht 5' 1.5" (1.562 m)   Wt 152 lb (68.9 kg)   SpO2 97%   BMI 28.26 kg/m  General:   Alert,  pleasant and cooperative in NAD Head:   Normocephalic and atraumatic. Neck:  Supple; no masses or thyromegaly. Lungs:  Clear throughout to auscultation.    Heart:  Regular rate and rhythm. Abdomen:  Soft, nontender and nondistended. Normal bowel sounds, without guarding, and without rebound.   Neurologic:  Alert and  oriented x4;  grossly normal neurologically.  Impression/Plan: Mackenzie [name] is here for an colonoscopy to be performed for history of colon polyps  Risks, benefits, limitations, and alternatives regarding  colonoscopy have been reviewed with the patient.  Questions have been answered.  All parties agreeable.   Lucilla Lame, MD  01/03/2018, 10:14 AM

## 2018-01-03 NOTE — Anesthesia Preprocedure Evaluation (Addendum)
Anesthesia Evaluation  Patient identified by MRN, date of birth, ID band Patient awake    Reviewed: Allergy & Precautions, NPO status , Patient's Chart, lab work & pertinent test results  Airway Mallampati: II  TM Distance: <3 FB Neck ROM: Full    Dental no notable dental hx.    Pulmonary former smoker,    Pulmonary exam normal breath sounds clear to auscultation       Cardiovascular + dysrhythmias Atrial Fibrillation  Rhythm:Irregular Rate:Normal  Normal stress test and echo 2016 Rate controlled afib   Neuro/Psych CVA negative psych ROS   GI/Hepatic negative GI ROS, Neg liver ROS,   Endo/Other  Hypothyroidism   Renal/GU negative Renal ROS     Musculoskeletal negative musculoskeletal ROS (+)   Abdominal Normal abdominal exam  (+)   Peds  Hematology negative hematology ROS (+)   Anesthesia Other Findings   Reproductive/Obstetrics                            Anesthesia Physical Anesthesia Plan  ASA: III  Anesthesia Plan: General   Post-op Pain Management:    Induction: Intravenous  PONV Risk Score and Plan: TIVA  Airway Management Planned: Natural Airway  Additional Equipment: None  Intra-op Plan:   Post-operative Plan:   Informed Consent: I have reviewed the patients History and Physical, chart, labs and discussed the procedure including the risks, benefits and alternatives for the proposed anesthesia with the patient or authorized representative who has indicated his/her understanding and acceptance.     Plan Discussed with: CRNA, Anesthesiologist and Surgeon  Anesthesia Plan Comments:         Anesthesia Quick Evaluation

## 2018-01-03 NOTE — Op Note (Signed)
[facility] Gastroenterology Patient Name: Mackenzie [name] Procedure Date: 01/03/2018 10:25 AM MRN: [id number] Account #: [id number] Date of Birth: 11-Jul-1927 Admit Type: Outpatient Age: 84 Room: [facility] OR ROOM 01 Gender: Female Note Status: Finalized Procedure:            Colonoscopy Indications:          High risk colon cancer surveillance: Personal history                        of colonic polyps Providers:            Lucilla Lame MD, MD Referring MD:         Ramonita Lab, MD (Referring MD) Medicines:            Propofol per Anesthesia Complications:        No immediate complications. Procedure:            Pre-Anesthesia Assessment:                       - Prior to the procedure, a History and Physical was                        performed, and patient medications and allergies were                        reviewed. The patient's tolerance of previous                        anesthesia was also reviewed. The risks and benefits of                        the procedure and the sedation options and risks were                        discussed with the patient. All questions were                        answered, and informed consent was obtained. Prior                        Anticoagulants: The patient has taken no previous                        anticoagulant or antiplatelet agents. ASA Grade                        Assessment: II - A patient with mild systemic disease.                        After reviewing the risks and benefits, the patient was                        deemed in satisfactory condition to undergo the                        procedure.                       After obtaining informed consent, the colonoscope was  passed under direct vision. Throughout the procedure,                        the patient's blood pressure, pulse, and oxygen                        saturations were monitored continuously. The [place] [id number]) was introduced through the                        anus and advanced to the the cecum, identified by                        appendiceal orifice and ileocecal valve. The                        colonoscopy was performed without difficulty. The                        patient tolerated the procedure well. The quality of                        the bowel preparation was excellent. Findings:      The perianal and digital rectal examinations were normal.      Three sessile polyps were found in the recto-sigmoid colon. The polyps       were 2 to 4 mm in size. These polyps were removed with a cold biopsy       forceps. Resection and retrieval were complete.      Multiple small-mouthed diverticula were found in the entire colon. Impression:           - Three 2 to 4 mm polyps at the recto-sigmoid colon,                        removed with a cold biopsy forceps. Resected and                        retrieved.                       - Diverticulosis in the entire examined colon. Recommendation:       - Discharge patient to home.                       - Resume previous diet.                       - Continue present medications.                       - Await pathology results. Procedure Code(s):    --- Professional ---                       [phone], Colonoscopy, flexible; with biopsy, single or                        multiple Diagnosis Code(s):    --- Professional ---  Z86.010, Personal history of colonic polyps                       D12.7, Benign neoplasm of rectosigmoid junction CPT copyright 2016 American Medical Association. All rights reserved. The codes documented in this report are preliminary and upon coder review may  be revised to meet current compliance requirements. Lucilla Lame MD, MD 01/03/2018 10:44:29 AM This report has been signed electronically. Number of Addenda: 0 Note Initiated On: 01/03/2018 10:25 AM Scope Withdrawal Time: 0 hours 5 minutes  20 seconds  Total Procedure Duration: 0 hours 12 minutes 27 seconds       [facility]

## 2018-01-06 ENCOUNTER — Encounter: Payer: Self-pay | Admitting: Gastroenterology

## 2018-01-07 ENCOUNTER — Encounter: Payer: Self-pay | Admitting: Gastroenterology

## 2018-09-10 ENCOUNTER — Other Ambulatory Visit: Payer: Self-pay | Admitting: Internal Medicine

## 2018-09-10 DIAGNOSIS — Z1231 Encounter for screening mammogram for malignant neoplasm of breast: Secondary | ICD-10-CM

## 2018-10-23 ENCOUNTER — Ambulatory Visit
Admission: RE | Admit: 2018-10-23 | Discharge: 2018-10-23 | Disposition: A | Discharge status: Home / Self Care | Payer: Medicare Other | Source: Ambulatory Visit | Attending: Internal Medicine | Admitting: Internal Medicine

## 2018-10-23 DIAGNOSIS — Z1231 Encounter for screening mammogram for malignant neoplasm of breast: Secondary | ICD-10-CM | POA: Insufficient documentation

## 2018-10-23 HISTORY — DX: Personal history of irradiation: Z92.3

## 2019-08-20 ENCOUNTER — Other Ambulatory Visit: Payer: Self-pay | Admitting: Internal Medicine

## 2019-08-20 DIAGNOSIS — Z1231 Encounter for screening mammogram for malignant neoplasm of breast: Secondary | ICD-10-CM

## 2019-10-27 ENCOUNTER — Ambulatory Visit
Admission: RE | Admit: 2019-10-27 | Discharge: 2019-10-27 | Disposition: A | Discharge status: Home / Self Care | Payer: Medicare PPO | Source: Ambulatory Visit | Attending: Internal Medicine | Admitting: Internal Medicine

## 2019-10-27 DIAGNOSIS — Z1231 Encounter for screening mammogram for malignant neoplasm of breast: Secondary | ICD-10-CM | POA: Diagnosis present

## 2021-11-01 ENCOUNTER — Other Ambulatory Visit: Payer: Self-pay

## 2021-11-01 ENCOUNTER — Emergency Department: Payer: Medicare PPO

## 2021-11-01 ENCOUNTER — Emergency Department
Admission: EM | Admit: 2021-11-01 | Discharge: 2021-11-01 | Discharge status: Against Medical Advice | Payer: Medicare PPO | Attending: Emergency Medicine | Admitting: Emergency Medicine

## 2021-11-01 ENCOUNTER — Encounter: Payer: Self-pay | Admitting: Emergency Medicine

## 2021-11-01 DIAGNOSIS — I10 Essential (primary) hypertension: Secondary | ICD-10-CM | POA: Diagnosis not present

## 2021-11-01 DIAGNOSIS — R079 Chest pain, unspecified: Secondary | ICD-10-CM | POA: Diagnosis present

## 2021-11-01 DIAGNOSIS — E039 Hypothyroidism, unspecified: Secondary | ICD-10-CM | POA: Insufficient documentation

## 2021-11-01 DIAGNOSIS — I4811 Longstanding persistent atrial fibrillation: Secondary | ICD-10-CM | POA: Diagnosis not present

## 2021-11-01 DIAGNOSIS — Z7901 Long term (current) use of anticoagulants: Secondary | ICD-10-CM | POA: Insufficient documentation

## 2021-11-01 LAB — BASIC METABOLIC PANEL
Anion gap: 8 (ref 5–15)
BUN: 20 mg/dL (ref 8–23)
CO2: 29 mmol/L (ref 22–32)
Calcium: 8.9 mg/dL (ref 8.9–10.3)
Chloride: 102 mmol/L (ref 98–111)
Creatinine, Ser: 0.86 mg/dL (ref 0.44–1.00)
GFR, Estimated: >60 mL/min (ref >60)
Glucose, Bld: 107 mg/dL — ABNORMAL HIGH (ref 70–99)
Potassium: 3.8 mmol/L (ref 3.5–5.1)
Sodium: 139 mmol/L (ref 135–145)

## 2021-11-01 LAB — CBC
HCT: 41.7 % (ref 36.0–46.0)
Hemoglobin: 13.6 g/dL (ref 12.0–15.0)
MCH: 30 pg (ref 26.0–34.0)
MCHC: 32.6 g/dL (ref 30.0–36.0)
MCV: 92.1 fL (ref 80.0–100.0)
Platelets: 230 10*3/uL (ref 150–400)
RBC: 4.53 MIL/uL (ref 3.87–5.11)
RDW: 13.5 % (ref 11.5–15.5)
WBC: 7.5 10*3/uL (ref 4.0–10.5)
nRBC: 0 % (ref 0.0–0.2)

## 2021-11-01 LAB — TROPONIN I (HIGH SENSITIVITY): Troponin I (High Sensitivity): 7 ng/L (ref <18)

## 2021-11-01 NOTE — ED Provider Notes (Signed)
[facility] Provider Note    Event Date/Time   First MD Initiated Contact with Patient 11/01/21 1811     (approximate)   History   Chest Pain   HPI  Mackenzie [name] is a 88 y.o. female with a past medical history of A. fib and sick sinus syndrome currently anticoagulated on Eliquis, hypothyroidism, arthritis, peripheral neuropathy, HTN and hypothyroidism who presents accompanied by her daughter for assessment of some sharp substernal chest pain she experienced earlier today that lasted about 30 minutes and resolved on its own.  She states she had a similar episode about a year ago but is not sure what this was from.  She states that she is currently symptom-free.  She has also been in her usual state of health before this happened without any recent fevers, chills, cough, shortness of breath, nausea, vomiting, diarrhea, urinary symptoms, back pain rash or extremity pain.  No recent falls or injuries.  She states she was sitting at rest when the symptoms began and when it resolved.  No medications for her symptoms prior to arrival.  She denies any history of GERD or esophagitis or spasm.  She states she is compliant with all her medications.  No other acute concerns at this time.      Physical Exam  Triage Vital Signs: ED Triage Vitals  Enc Vitals Group     BP 11/01/21 1559 (!) 151/101     Pulse Rate 11/01/21 1559 80     Resp 11/01/21 1559 18     Temp 11/01/21 1559 98.2 F (36.8 C)     Temp Source 11/01/21 1559 Oral     SpO2 11/01/21 1559 95 %     Weight 11/01/21 1557 151 lb 14.4 oz (68.9 kg)     Height 11/01/21 1557 5' 1.5" (1.562 m)     Head Circumference --      Peak Flow --      Pain Score 11/01/21 1556 0     Pain Loc --      Pain Edu? --      Excl. in [place]? --     Most recent vital signs: Vitals:   11/01/21 1559  BP: (!) 151/101  Pulse: 80  Resp: 18  Temp: 98.2 F (36.8 C)  SpO2: 95%    General: Awake, no distress.  CV:  Good peripheral  perfusion.  Irregular rhythm.  Slight systolic murmur. Resp:  Normal effort.  Clear bilaterally. Abd:  No distention.  Soft throughout. Other:  No significant lower extremity edema   ED Results / Procedures / Treatments  Labs (all labs ordered are listed, but only abnormal results are displayed) Labs Reviewed  BASIC METABOLIC PANEL - Abnormal; Notable for the following components:      Result Value   Glucose, Bld 107 (*)    All other components within normal limits  CBC  TROPONIN I (HIGH SENSITIVITY)  TROPONIN I (HIGH SENSITIVITY)     EKG  EKG is remarkable for A. fib with a ventricular rate of 77, left axis deviation, unremarkable intervals with a nonspecific ST change in V2 and inferior leads without other clear evidence of acute ischemia or significant arrhythmia.   RADIOLOGY Chest reviewed by myself shows no focal consoidation, effusion, edema, pneumothorax or other clear acute thoracic process. I also reviewed radiology interpretation and agree with findings described.    PROCEDURES:  Critical Care performed:    MEDICATIONS ORDERED IN ED: Medications - No data to  display   IMPRESSION / MDM / ASSESSMENT AND PLAN / ED COURSE  I reviewed the triage vital signs and the nursing notes.                              Differential diagnosis includes, but is not limited to ACS, PE, dissection, pneumonia, pneumothorax, esophageal spasm, bronchitis, GERD and chest wall inflammation or spasm.  Patient's history of high blood pressure as well as her age put her at high risk for ACS  Overall given description of symptoms without any hypoxia tachypnea or tachycardia and patient denying any shortness of breath with transient nature of symptoms and patient reporting compliance with her Eliquis I have a low suspicion for PE.  In addition based on patient's history and exam I have a low suspicion for dissection.  EKG is remarkable for A. fib with a ventricular rate of 77, left axis  deviation, unremarkable intervals with a nonspecific ST change in V2 and inferior leads without other clear evidence of acute ischemia or significant arrhythmia.  Chest reviewed by myself shows no focal consoidation, effusion, edema, pneumothorax or other clear acute thoracic process. I also reviewed radiology interpretation and agree with findings described.  Given absence of any cough or shortness of breath or leukocytosis seen on CBC I have a low suspicion for pneumonia or acute infectious process.  BMP without other significant electrolyte or metabolic derangements.  Will initial troponin is nonelevated and reassuring given symptom onset around 2 PM initial troponin collected around 4 PM I think patient should obtain a 3-hour delta troponin to rule out ACS.  She does not wish to stay for this and states that she understands she could have suffered a small heart attack but I am unable to diagnose with isolated troponin.  Discussed that this would involve being admitted to the hospital if this were the case and possibly intervened on and that if left undiagnosed could be life-threatening.  She states he understands this and still wishes to be discharged and will call her cardiologist tomorrow.  I think she has capacity to make this decision and was discharged in stable condition against my advice.     FINAL CLINICAL IMPRESSION(S) / ED DIAGNOSES   Final diagnoses:  Chest pain, unspecified type  Hypertension, unspecified type  Anticoagulated  Longstanding persistent atrial fibrillation ([place])     Rx / DC Orders   ED Discharge Orders     None        Note:  This document was prepared using Dragon voice recognition software and may include unintentional dictation errors.   Lucrezia Starch, MD 11/01/21 [phone]

## 2021-11-01 NOTE — ED Triage Notes (Signed)
Pt comes into the ED via POV c/o chest pain that is centrally located with no radiation.  PT denies any SHOB, nausea or dizziness.  PT in NAD at this time with even and unlabored respirations. Pt denies any cardiac history.

## 2021-11-01 NOTE — ED Notes (Signed)
Blue lab tube collected and sent with save label/.

## 2021-11-23 ENCOUNTER — Other Ambulatory Visit: Payer: Self-pay

## 2021-11-23 ENCOUNTER — Emergency Department
Admission: EM | Admit: 2021-11-23 | Discharge: 2021-11-23 | Disposition: A | Discharge status: Home / Self Care | Payer: Medicare PPO | Attending: Emergency Medicine | Admitting: Emergency Medicine

## 2021-11-23 DIAGNOSIS — W268XXA Contact with other sharp object(s), not elsewhere classified, initial encounter: Secondary | ICD-10-CM | POA: Diagnosis not present

## 2021-11-23 DIAGNOSIS — S81812A Laceration without foreign body, left lower leg, initial encounter: Secondary | ICD-10-CM | POA: Insufficient documentation

## 2021-11-23 DIAGNOSIS — E039 Hypothyroidism, unspecified: Secondary | ICD-10-CM | POA: Insufficient documentation

## 2021-11-23 DIAGNOSIS — Z7901 Long term (current) use of anticoagulants: Secondary | ICD-10-CM | POA: Diagnosis not present

## 2021-11-23 DIAGNOSIS — I4891 Unspecified atrial fibrillation: Secondary | ICD-10-CM | POA: Diagnosis not present

## 2021-11-23 DIAGNOSIS — Y92811 Bus as the place of occurrence of the external cause: Secondary | ICD-10-CM | POA: Insufficient documentation

## 2021-11-23 DIAGNOSIS — Z79899 Other long term (current) drug therapy: Secondary | ICD-10-CM | POA: Diagnosis not present

## 2021-11-23 DIAGNOSIS — S8012XA Contusion of left lower leg, initial encounter: Secondary | ICD-10-CM

## 2021-11-23 DIAGNOSIS — S8992XA Unspecified injury of left lower leg, initial encounter: Secondary | ICD-10-CM | POA: Diagnosis present

## 2021-11-23 NOTE — ED Provider Notes (Signed)
[facility] EMERGENCY DEPARTMENT Provider Note   CSN: [id number] Arrival date & time: 11/23/21  1911     History  Chief Complaint  Patient presents with   Leg Injury    Mackenzie [name] is a 88 y.o. female with past medical history of atrial fibrillation on Eliquis, history of stroke and hypothyroidism and right knee osteoarthritis presents to the emergency department for left anterior leg skin tear and hematoma.  Patient was stepping up to get into a van when she slipped and stumbled backwards scraping her left anterior shin on the step of the van.  She has some skin tears to the distal third of the shin with a small palpable fluctuant hematoma.  She has had some mild drainage from the skin tears.  She is ambulatory with a cane at baseline denies any other injury to her body.  She denies any fall, head injury, neck pain only slipped and scraped her left shin on the step  HPI     Home Medications Prior to Admission medications   Medication Sig Start Date End Date Taking? Authorizing Provider  apixaban (ELIQUIS) 2.5 MG TABS tablet TAKE 1 TABLET BY MOUTH TWICE DAILY 07/19/17   [provider]  Cholecalciferol (D-5000) 5000 units TABS Take by mouth.    [provider]  ibuprofen (ADVIL,MOTRIN) 200 MG tablet Take by mouth.    [provider]  levothyroxine (SYNTHROID, LEVOTHROID) 50 MCG tablet TAKE 1 TABLET BY MOUTH ONCE DAILY ON AN EMPTY STOMACH. WAIT 30 MINUTES BEFORE TAKING OTHER MEDS. 08/13/17   [provider]  lisinopril (PRINIVIL,ZESTRIL) 20 MG tablet TAKE 1 TABLET BY MOUTH ONCE DAILY 10/21/17   [provider]  Magnesium Carbonate (MAGNESIUM GLUCONATE) 54mg /46ml syringe Take by mouth.    [provider]  omeprazole (PRILOSEC) 20 MG capsule TAKE 1 CAPSULE BY MOUTH TWICE DAILY 01/25/17   [provider]  Probiotic Product (PROBIOTIC-10) CAPS Take 1 capsule by mouth.    [provider]  temazepam  (RESTORIL) 30 MG capsule TAKE 1 CAPSULE BY MOUTH AT BEDTIME AS NEEDED FOR SLEEP 04/29/17   [provider]  vitamin B-12 (CYANOCOBALAMIN) 1000 MCG tablet Take by mouth.    [provider]      Allergies    Patient has no known allergies.    Review of Systems   Review of Systems  Physical Exam Updated Vital Signs BP (!) 188/97    Pulse 75    Temp 97.9 F (36.6 C) (Oral)    Resp 18    Ht 5' 1.5" (1.562 m)    Wt 60.8 kg    SpO2 96%    BMI 24.91 kg/m  Physical Exam Constitutional:      Appearance: She is well-developed.  HENT:     Head: Normocephalic and atraumatic.     Right Ear: External ear normal.     Left Ear: External ear normal.     Nose: Nose normal.  Eyes:     Conjunctiva/sclera: Conjunctivae normal.     Pupils: Pupils are equal, round, and reactive to light.  Cardiovascular:     Rate and Rhythm: Normal rate.  Pulmonary:     Effort: Pulmonary effort is normal. No respiratory distress.     Breath sounds: Normal breath sounds.  Abdominal:     Palpations: Abdomen is soft.     Tenderness: There is no abdominal tenderness.  Musculoskeletal:        General: No deformity. Normal range of  motion.     Cervical back: Normal range of motion.     Comments: Left anterior leg with large skin tears along the anterior shin x2.  Distal shin with a small 3 x 3 cm hematoma that is compressed and blood evacuated from the hematoma.  Patient has very thin skin along the skin tears, skin was reapproximated after cleansing the underlying tissue with Betadine and saline.  After approximation of skin tears, Dermabond was applied.  There is no active bleeding.  Patient has good ankle plantarflexion dorsiflexion.  Normal knee range of motion, she is able to actively straight leg raise the left leg.  She has no pain with logrolling of the left hip.  She is ambulatory with a cane with nonantalgic gait.  Skin:    General: Skin is warm and dry.     Findings: No rash.  Neurological:      Mental Status: She is alert and oriented to person, place, and time.     Cranial Nerves: No cranial nerve deficit.     Coordination: Coordination normal.  Psychiatric:        Behavior: Behavior normal.    ED Results / Procedures / Treatments   Labs (all labs ordered are listed, but only abnormal results are displayed) Labs Reviewed - No data to display  EKG None  Radiology No results found.  Procedures .Marland KitchenLaceration Repair  Date/Time: 11/23/2021 9:03 PM Performed by: Duanne Guess, PA-C Authorized by: Duanne Guess, PA-C   Treatment:    Area cleansed with:  Povidone-iodine and saline   Amount of cleaning:  Standard Skin repair:    Repair method:  Tissue adhesive Approximation:    Approximation:  Close Repair type:    Repair type:  Simple Post-procedure details:    Dressing:  Non-adherent dressing and bulky dressing   Procedure completion:  Tolerated    Medications Ordered in ED Medications - No data to display  ED Course/ Medical Decision Making/ A&P                           Medical Decision Making  88 year old female with skin tear to the left lower leg.  She has very thin skin and she is on Eliquis.  She had a small hematoma, this was evacuated completely and compressive bulky dressing was applied.  She has no pain in her leg with ambulation, do not suspect any type of fracture.  Also her mechanism of injury was very mild and she did not fall.  She appears well, vital signs are stable.  She is stable and ready for discharge.  She is educated on wound care and follow-up. Final Clinical Impression(s) / ED Diagnoses Final diagnoses:  Noninfected skin tear of left lower extremity, initial encounter  Hematoma of left lower leg    Rx / DC Orders ED Discharge Orders     None         Renata Caprice 11/23/21 2104    Naaman Plummer, MD 11/24/21 [phone]

## 2021-11-23 NOTE — ED Triage Notes (Signed)
Pt presents to ER from home.  Pt states she was getting up into a bus and when she walked up a step, her left leg slipped and she scraped her leg on the step.  Pt has some bruising and controlled bleeding noted to left lower leg.  Pt is on eliquis and is concerned d/t "blood pooling" in her lower leg.  Pt denies hitting head or any other injuries.  Pt is on eliquis.  Otherwise A&O x4.

## 2021-11-23 NOTE — Discharge Instructions (Addendum)
Please keep current dressing clean and dry.  Do not remove dressing until Monday morning 11/27/2021.  Once dressing has been removed on Monday morning, you may cover dressing as needed with gauze and paper tape if there is any continued drainage but otherwise if wounds are dry, you may leave uncovered.  On Monday, 11/27/2021 you may shower and get wet.  Do not submerge underwater.  Return to the ER follow-up with walk-in clinic or primary care provider if any increasing pain swelling warmth or redness.

## 2021-12-27 ENCOUNTER — Encounter: Payer: Medicare PPO | Attending: Internal Medicine | Admitting: Internal Medicine

## 2021-12-27 ENCOUNTER — Other Ambulatory Visit: Payer: Self-pay

## 2021-12-27 DIAGNOSIS — Z7901 Long term (current) use of anticoagulants: Secondary | ICD-10-CM | POA: Diagnosis not present

## 2021-12-27 DIAGNOSIS — T798XXA Other early complications of trauma, initial encounter: Secondary | ICD-10-CM

## 2021-12-27 DIAGNOSIS — Z8673 Personal history of transient ischemic attack (TIA), and cerebral infarction without residual deficits: Secondary | ICD-10-CM | POA: Insufficient documentation

## 2021-12-27 DIAGNOSIS — W19XXXA Unspecified fall, initial encounter: Secondary | ICD-10-CM | POA: Diagnosis not present

## 2021-12-27 DIAGNOSIS — I10 Essential (primary) hypertension: Secondary | ICD-10-CM | POA: Insufficient documentation

## 2021-12-27 DIAGNOSIS — X58XXXA Exposure to other specified factors, initial encounter: Secondary | ICD-10-CM | POA: Insufficient documentation

## 2021-12-27 DIAGNOSIS — I4891 Unspecified atrial fibrillation: Secondary | ICD-10-CM | POA: Diagnosis not present

## 2021-12-27 DIAGNOSIS — L97822 Non-pressure chronic ulcer of other part of left lower leg with fat layer exposed: Secondary | ICD-10-CM

## 2021-12-27 DIAGNOSIS — E039 Hypothyroidism, unspecified: Secondary | ICD-10-CM | POA: Insufficient documentation

## 2021-12-28 NOTE — Progress Notes (Signed)
Lubrano, CALEAH TORTORELLI ([id number]) ?Visit Report for 12/27/2021 ?Chief Complaint Document Details ?Patient Name: Mackenzie [name], Mackenzie [name] ?Date of Service: 12/27/2021 8:45 AM ?Medical Record Number: [id number] ?Patient Account Number: [id number] ?Date of Birth/Sex: 1927/04/07 (88 y.o. F) ?Treating RN: Carlene Coria ?Primary Care Provider: [facility] Other Clinician: ?Referring Provider: [facility] ?Treating Provider/Extender: Kalman Shan ?Weeks in Treatment: 0 ?Information Obtained from: Patient ?Chief Complaint ?Left lower extremity wound following trauma ?Electronic Signature(s) ?Signed: 12/27/2021 10:10:06 AM By: Kalman Shan DO ?Entered By: Kalman Shan on 12/27/2021 09:54:31 ?Amsler, Armella H. ([id number]) ?-------------------------------------------------------------------------------- ?Debridement Details ?Patient Name: Mackenzie [name], Mackenzie [name] ?Date of Service: 12/27/2021 8:45 AM ?Medical Record Number: [id number] ?Patient Account Number: [id number] ?Date of Birth/Sex: 1927/06/15 (88 y.o. F) ?Treating RN: Carlene Coria ?Primary Care Provider: [facility] Other Clinician: ?Referring Provider: [facility] ?Treating Provider/Extender: Kalman Shan ?Weeks in Treatment: 0 ?Debridement Performed for ?Wound #1 Left,Anterior Lower Leg ?Assessment: ?Performed By: Physician Kalman Shan, MD ?Debridement Type: Debridement ?Level of Consciousness (Pre- ?Awake and Alert ?procedure): ?Pre-procedure Verification/Time Out ?Yes - 09:38 ?Taken: ?Start Time: 09:38 ?Pain Control: Lidocaine 4% Topical Solution ?Total Area Debrided (L x W): 4 (cm) x 4 (cm) = 16 (cm?) ?Tissue and other material ?Viable, Non-Viable, Slough, Subcutaneous, Skin: Dermis , Skin: Epidermis, Slough ?debrided: ?Level: Skin/Subcutaneous Tissue ?Debridement Description: Excisional ?Instrument: Curette ?Bleeding: Moderate ?Hemostasis Achieved: Pressure ?End Time: 09:42 ?Procedural Pain: 0 ?Post Procedural Pain: 0 ?Response to Treatment: Procedure was tolerated  well ?Level of Consciousness (Post- ?Awake and Alert ?procedure): ?Post Debridement Measurements of Total Wound ?Length: (cm) 11 ?Width: (cm) 8 ?Depth: (cm) 0.1 ?Volume: (cm?) 6.912 ?Character of Wound/Ulcer Post Debridement: Improved ?Post Procedure Diagnosis ?Same as Pre-procedure ?Electronic Signature(s) ?Signed: 12/27/2021 10:10:06 AM By: Kalman Shan DO ?Signed: 12/28/2021 4:22:30 PM By: Carlene Coria RN ?Entered ByCarlene Coria on 12/27/2021 09:40:44 ?Mackenzie [name], Mackenzie H. ([id number]) ?-------------------------------------------------------------------------------- ?HPI Details ?Patient Name: Mackenzie [name], Mackenzie [name] ?Date of Service: 12/27/2021 8:45 AM ?Medical Record Number: [id number] ?Patient Account Number: [id number] ?Date of Birth/Sex: August 31, 1927 (88 y.o. F) ?Treating RN: Carlene Coria ?Primary Care Provider: [facility] Other Clinician: ?Referring Provider: [facility] ?Treating Provider/Extender: Kalman Shan ?Weeks in Treatment: 0 ?History of Present Illness ?HPI Description: Admission 12/27/2021 ?Ms. Jalene Demo is a 88 year old female with a past medical history of A-fib on Eliquis, CVA and hypothyroidism that presents to the clinic for a ?6-week history of wound to the left lower extremity. 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 She visited the ED for this issue. She had a hematoma and this was evacuated. She also visited her primary ?care physician for wound checks. She has been using Vaseline with dressing changes. On 3/10 she was started on Keflex for wound infection. ?Over the past 6 weeks patient reports improvement in wound healing. She currently denies signs of infection. ?Electronic Signature(s) ?Signed: 12/27/2021 10:10:06 AM By: Kalman Shan DO ?Entered By: Kalman Shan on 12/27/2021 10:05:37 ?Mackenzie [name], Mackenzie H.  ([id number]) ?-------------------------------------------------------------------------------- ?Physical Exam Details ?Patient Name: Mackenzie [name], Mackenzie [name] ?Date of Service: 12/27/2021 8:45 AM ?Medical Record Number: [id number] ?Patient Account Number: [id number] ?Date of Birth/Sex: 12/27/1926 (88 y.o. F) ?Treating RN: Carlene Coria ?Primary Care Provider: [facility] Other Clinician: ?Referring Provider: [facility] ?Treating Provider/Extender: Kalman Shan ?Weeks in Treatment: 0 ?Constitutional ?. ?Cardiovascular ?Marland Kitchen ?Psychiatric ?Marland Kitchen ?Notes ?Left lower extremity: Multiple open wounds with granulation tissue and nonviable tissue present. No signs of surrounding infection. No swelling on ?exam. ?  Electronic Signature(s) ?Signed: 12/27/2021 10:10:06 AM By: Kalman Shan DO ?Entered By: Kalman Shan on 12/27/2021 10:06:42 ?Mackenzie [name], Mackenzie H. ([id number]) ?-------------------------------------------------------------------------------- ?Physician Orders Details ?Patient Name: Mackenzie [name], Mackenzie [name] ?Date of Service: 12/27/2021 8:45 AM ?Medical Record Number: [id number] ?Patient Account Number: [id number] ?Date of Birth/Sex: 12-06-1926 (88 y.o. 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 ?Bathing/ Shower/ Hygiene ?o May shower; gently cleanse wound with antibacterial soap, rinse and pat dry prior to dressing wounds ?Edema Control - Lymphedema / Segmental Compressive Device / Other ?o Elevate, Exercise Daily and Avoid  Standing for Long Periods of Time. ?o Elevate legs to the level of the heart and pump ankles as often as possible ?o Elevate leg(s) parallel to the floor when sitting. ?Wound Treatment ?Wound #1 - Lower Leg Wound Laterality: Left, Anterior ?Cleanser: Byram Ancillary Kit - 15 Day Supply (DME) (Generic) Every Other Day/30 Days ?Discharge Instructions: Use supplies as instructed; Kit contains: (15) Saline Bullets; (15) 3x3 Gauze; 15 pr Gloves ?Cleanser: Soap and Water Every Other Day/30 Days ?Discharge Instructions: Gently cleanse wound with antibacterial soap, rinse and pat dry prior to dressing wounds ?Primary Dressing: Hydrofera Blue Ready Transfer Foam, 2.5x2.5 (in/in) (DME) (Generic) Every Other Day/30 Days ?Discharge Instructions: Apply Hydrofera Blue Ready to wound bed as directed ?Secondary Dressing: Kerlix 4.5 x 4.1 (in/yd) (DME) (Generic) Every Other Day/30 Days ?Discharge Instructions: Apply Kerlix 4.5 x 4.1 (in/yd) as instructed ?Secured With: Medipore Tape - 27M Medipore H Soft Cloth Surgical Tape, 2x2 (in/yd) (DME) (Generic) Every Other Day/30 Days ?Electronic Signature(s) ?Signed: 12/27/2021 10:10:06 AM By: Kalman Shan DO ?Signed: 12/28/2021 4:22:30 PM By: Carlene Coria RN ?Entered ByCarlene Coria on 12/27/2021 09:44:21 ?Mackenzie [name], Mackenzie H. ([id number]) ?-------------------------------------------------------------------------------- ?Problem List Details ?Patient Name: Mackenzie [name], Mackenzie [name] ?Date of Service: 12/27/2021 8:45 AM ?Medical Record Number: [id number] ?Patient Account Number: [id number] ?Date of Birth/Sex: Jan 05, 1927 (88 y.o. F) ?Treating RN: Carlene Coria ?Primary Care Provider: [facility] Other Clinician: ?Referring Provider: [facility] ?Treating Provider/Extender: Kalman Shan ?Weeks in Treatment: 0 ?Active Problems ?ICD-10 ?Encounter ?Code Description Active Date MDM ?Diagnosis ?H74.142 Non-pressure chronic ulcer of other part of left lower leg with fat layer 12/27/2021 No  Yes ?exposed ?T79.8XXA Other early complications of trauma, initial encounter 12/27/2021 No Yes ?Z79.01 Long term (current) use of anticoagulants 12/27/2021 No Yes ?I48.91 Unspecified atrial fibrillation 12/27/2021 No Yes ?Inactive Problems ?Resolved Problems ?Electronic Signature(s) ?Signed: 12/27/2021 10:10:06

## 2021-12-28 NOTE — Progress Notes (Signed)
Wingate, Mackenzie [name] ([id number]) ?Visit Report for 12/27/2021 ?Abuse Risk Screen Details ?Patient Name: Mackenzie [name], Mackenzie [name] ?Date of Service: 12/27/2021 8:45 AM ?Medical Record Number: [id number] ?Patient Account Number: [id number] ?Date of Birth/Sex: 05-12-1927 (88 y.o. F) ?Treating RN: Carlene Coria ?Primary Care Aquil Duhe: [facility] Other Clinician: ?Referring Julias Mould: [facility] ?Treating Nusayba Cadenas/Extender: Kalman Shan ?Weeks in Treatment: 0 ?Abuse Risk Screen Items ?Answer ?ABUSE RISK SCREEN: ?Has anyone close to you tried to hurt or harm you recentlyo No ?Do you feel uncomfortable with anyone in your familyo No ?Has anyone forced you do things that you didnot want to doo No ?Electronic Signature(s) ?Signed: 12/28/2021 4:22:30 PM By: Carlene Coria RN ?Entered ByCarlene Coria on 12/27/2021 09:04:37 ?Campoverde, Serafina H. ([id number]) ?-------------------------------------------------------------------------------- ?Activities of Daily Living Details ?Patient Name: Mackenzie [name] ?Date of Service: 12/27/2021 8:45 AM ?Medical Record Number: [id number] ?Patient Account Number: [id number] ?Date of Birth/Sex: [birth year] (88 y.o. F) ?Treating RN: Carlene Coria ?Primary Care Zymarion Favorite: [facility] Other Clinician: ?Referring Harlea Goetzinger: [facility] ?Treating Mariel Gaudin/Extender: Kalman Shan ?Weeks in Treatment: 0 ?Activities of Daily Living Items ?Answer ?Activities of Daily Living (Please select one for each item) ?[place] ?Take Medications Completely Able ?Use Telephone Completely Able ?Care for Appearance Completely Able ?Use Toilet Completely Able ?Bath / Shower Completely Able ?Dress Self Completely Able ?Feed Self Completely Able ?Walk Completely Able ?Get In / Out Bed Completely Able ?Housework Completely Able ?Prepare Meals Completely Able ?Handle Money Completely Able ?Shop for Self Completely Able ?Electronic Signature(s) ?Signed: 12/28/2021 4:22:30 PM By: Carlene Coria RN ?Entered ByCarlene Coria on 12/27/2021 09:04:57 ?Potempa, Wenona H. ([id number]) ?-------------------------------------------------------------------------------- ?Education Screening Details ?Patient Name: Mackenzie [name] ?Date of Service: 12/27/2021 8:45 AM ?Medical Record Number: [id number] ?Patient Account Number: [id number] ?Date of Birth/Sex: 23-Jun-1927 (88 y.o. F) ?Treating RN: Carlene Coria ?Primary Care Rodnesha Elie: [facility] Other Clinician: ?Referring Lacreshia Bondarenko: [facility] ?Treating Minnie Shi/Extender: Kalman Shan ?Weeks in Treatment: 0 ?Primary Learner Assessed: Patient ?Learning Preferences/Education Level/Primary Language ?Learning Preference: Explanation ?Highest Education Level: College or Above ?Preferred Language: English ?Cognitive Barrier ?Language Barrier: No ?Translator Needed: No ?Memory Deficit: No ?Emotional Barrier: No ?Cultural/Religious Beliefs Affecting Medical Care: No ?Physical Barrier ?Impaired Vision: Yes Glasses ?Impaired Hearing: No ?Decreased Hand dexterity: No ?Knowledge/Comprehension ?Knowledge Level: Medium ?Comprehension Level: High ?Ability to understand written instructions: High ?Ability to understand verbal instructions: High ?Motivation ?Anxiety Level: Anxious ?Cooperation: Cooperative ?Education Importance: Acknowledges Need ?Interest in Health Problems: Asks Questions ?Perception: Coherent ?Willingness to Engage in Self-Management ?High ?Activities: ?Readiness to Engage in Self-Management ?High ?Activities: ?Electronic Signature(s) ?Signed: 12/28/2021 4:22:30 PM By: Carlene Coria RN ?Entered ByCarlene Coria on 12/27/2021 09:05:27 ?Shehadeh, Clotilde H. ([id number]) ?-------------------------------------------------------------------------------- ?Fall Risk Assessment Details ?Patient Name: Mackenzie [name] ?Date of Service: 12/27/2021 8:45 AM ?Medical Record Number: [id number] ?Patient Account Number: [id number] ?Date of Birth/Sex: 1927/01/11 (88 y.o. F) ?Treating RN: Carlene Coria ?Primary Care  Vanna Sailer: [facility] Other Clinician: ?Referring Angello Chien: [facility] ?Treating Blayke Pinera/Extender: Kalman Shan ?Weeks in Treatment: 0 ?Fall Risk Assessment Items ?Have you had 2 or more falls in the last 12 monthso 0 No ?Have you had any fall that resulted in injury in the last 12 monthso 0 No ?FALLS RISK SCREEN ?History of falling - immediate or within 3 months 0 No ?Secondary diagnosis (Do you have 2 or more medical diagnoseso) 0 No ?Ambulatory aid ?None/bed rest/wheelchair/nurse 0 No ?Crutches/cane/walker 0 No ?Furniture 0 No ?Intravenous therapy Access/Saline/Heparin Lock 0 No ?Gait/Transferring ?Normal/ bed rest/ wheelchair 0 No ?Weak (short steps  with or without shuffle, stooped but able to lift head while walking, may ?0 No ?seek support from furniture) ?Impaired (short steps with shuffle, may have difficulty arising from chair, head down, impaired ?0 No ?balance) ?Mental Status ?Oriented to own ability 0 No ?Electronic Signature(s) ?Signed: 12/28/2021 4:22:30 PM By: Carlene Coria RN ?Entered ByCarlene Coria on 12/27/2021 09:05:35 ?Angert, Pahoua H. ([id number]) ?-------------------------------------------------------------------------------- ?Foot Assessment Details ?Patient Name: [name], Mackenzie [name] ?Date of Service: 12/27/2021 8:45 AM ?Medical Record Number: [id number] ?Patient Account Number: [id number] ?Date of Birth/Sex: May 24, 1927 (88 y.o. F) ?Treating RN: Carlene Coria ?Primary Care Sameera Betton: [facility] Other Clinician: ?Referring Elleanor Guyett: [facility] ?Treating Ravonda Brecheen/Extender: Kalman Shan ?Weeks in Treatment: 0 ?Foot Assessment Items ?Site Locations ?+ = Sensation present, - = Sensation absent, C = Callus, U = Ulcer ?R = Redness, W = Warmth, M = Maceration, PU = Pre-ulcerative lesion ?F = Fissure, S = Swelling, D = Dryness ?Assessment ?Right: Left: ?Other Deformity: No No ?Prior Foot Ulcer: No No ?Prior Amputation: No No ?Charcot Joint: No No ?Ambulatory Status: Ambulatory Without Help ?Gait:  Steady ?Electronic Signature(s) ?Signed: 12/28/2021 4:22:30 PM By: Carlene Coria RN ?Entered ByCarlene Coria on 12/27/2021 09:08:07 ?Eyer, Amiayah H. ([id number]) ?-------------------------------------------------------------------------------- ?Nutrition Risk Screening Details ?Patient Name: ANN, BOHNE ?Date of Service: 12/27/2021 8:45 AM ?Medical Record Number: [id number] ?Patient Account Number: [id number] ?Date of Birth/Sex: 29-Dec-1926 (88 y.o. F) ?Treating RN: Carlene Coria ?Primary Care Yovanni Frenette: [facility] Other Clinician: ?Referring Lyriq Finerty: [facility] ?Treating Hercules Hasler/Extender: Kalman Shan ?Weeks in Treatment: 0 ?Height (in): 61 ?Weight (lbs): 136 ?Body Mass Index (BMI): 25.7 ?Nutrition Risk Screening Items ?Score Screening ?NUTRITION RISK SCREEN: ?I have an illness or condition that made me change the kind and/or amount of food I eat 0 No ?I eat fewer than two meals per day 0 No ?I eat few fruits and vegetables, or milk products 0 No ?I have three or more drinks of beer, liquor or wine almost every day 0 No ?I have tooth or mouth problems that make it hard for me to eat 0 No ?I don't always have enough money to buy the food I need 0 No ?I eat alone most of the time 0 No ?I take three or more different prescribed or over-the-counter drugs a day 1 Yes ?Without wanting to, I have lost or gained 10 pounds in the last six months 0 No ?I am not always physically able to shop, cook and/or feed myself 0 No ?Nutrition Protocols ?Good Risk Protocol 0 No interventions needed ?Moderate Risk Protocol ?High Risk Proctocol ?Risk Level: Good Risk ?Score: 1 ?Electronic Signature(s) ?Signed: 12/28/2021 4:22:30 PM By: Carlene Coria RN ?Entered By: Carlene Coria on 12/27/2021 09:05:55 ?

## 2021-12-28 NOTE — Progress Notes (Signed)
Mcleary, SHALAY [name] ([id number]) ?Visit Report for 12/27/2021 ?Allergy List Details ?Patient Name: Mackenzie [name], Mackenzie [name] ?Date of Service: 12/27/2021 8:45 AM ?Medical Record Number: [id number] ?Patient Account Number: [id number] ?Date of Birth/Sex: 1927/04/17 (88 y.o. F) ?Treating RN: Carlene Coria ?Primary Care Emmett Bracknell: [facility] Other Clinician: ?Referring Stelios Kirby: [facility] ?Treating Gaberiel Youngblood/Extender: Kalman Shan ?Weeks in Treatment: 0 ?Allergies ?Active Allergies ?No Known Allergies ?Allergy Notes ?Electronic Signature(s) ?Signed: 12/28/2021 4:22:30 PM By: Carlene Coria RN ?Entered By: Carlene Coria on 12/27/2021 09:01:57 ?Laye, Marilynn H. ([id number]) ?-------------------------------------------------------------------------------- ?Arrival Information Details ?Patient Name: Mackenzie [name] ?Date of Service: 12/27/2021 8:45 AM ?Medical Record Number: [id number] ?Patient Account Number: [id number] ?Date of Birth/Sex: [birth year] (88 y.o. F) ?Treating RN: Carlene Coria ?Primary Care Marlies Ligman: [facility] Other Clinician: ?Referring Betzabe Bevans: [facility] ?Treating Duron Meister/Extender: Kalman Shan ?Weeks in Treatment: 0 ?Visit Information ?Patient Arrived: Ambulatory ?Arrival Time: 08:56 ?Accompanied By: daughter ?Transfer Assistance: None ?Patient Identification Verified: Yes ?Secondary Verification Process Completed: Yes ?Patient Requires Transmission-Based Precautions: No ?Patient Has Alerts: No ?Electronic Signature(s) ?Signed: 12/28/2021 4:22:30 PM By: Carlene Coria RN ?Entered By: Carlene Coria on 12/27/2021 09:00:03 ?Nadeem, Skyleen H. ([id number]) ?-------------------------------------------------------------------------------- ?Clinic Level of Care Assessment Details ?Patient Name: Mackenzie [name] ?Date of Service: 12/27/2021 8:45 AM ?Medical Record Number: [id number] ?Patient Account Number: [id number] ?Date of Birth/Sex: 1926/11/04 (88 y.o. F) ?Treating RN: Carlene Coria ?Primary Care Kendyl Festa: [facility] Other  Clinician: ?Referring Lenzi Marmo: [facility] ?Treating Davelyn Gwinn/Extender: Kalman Shan ?Weeks in Treatment: 0 ?Clinic Level of Care Assessment Items ?TOOL 1 Quantity Score ?X - Use when EandM and Procedure is performed on INITIAL visit 1 0 ?ASSESSMENTS - Nursing Assessment / Reassessment ?X - General Physical Exam (combine w/ comprehensive assessment (listed just below) when performed on new ?1 20 ?pt. evals) ?X- 1 25 ?Comprehensive Assessment (HX, ROS, Risk Assessments, Wounds Hx, etc.) ?ASSESSMENTS - Wound and Skin Assessment / Reassessment ?'[]'$  - Dermatologic / Skin Assessment (not related to wound area) 0 ?ASSESSMENTS - Ostomy and/or Continence Assessment and Care ?'[]'$  - Incontinence Assessment and Management 0 ?'[]'$  - 0 ?Ostomy Care Assessment and Management (repouching, etc.) ?PROCESS - Coordination of Care ?X - Simple Patient / Family Education for ongoing care 1 15 ?'[]'$  - 0 ?Complex (extensive) Patient / Family Education for ongoing care ?X- 1 10 ?Staff obtains Consents, Records, Test Results / Process Orders ?'[]'$  - 0 ?Staff telephones HHA, Nursing Homes / Clarify orders / etc ?'[]'$  - 0 ?Routine Transfer to another Facility (non-emergent condition) ?'[]'$  - 0 ?Routine Hospital Admission (non-emergent condition) ?X- 1 15 ?New Admissions / Biomedical engineer / Ordering NPWT, Apligraf, etc. ?'[]'$  - 0 ?Emergency Hospital Admission (emergent condition) ?PROCESS - Special Needs ?'[]'$  - Pediatric / Minor Patient Management 0 ?'[]'$  - 0 ?Isolation Patient Management ?'[]'$  - 0 ?Hearing / Language / Visual special needs ?'[]'$  - 0 ?Assessment of Community assistance (transportation, D/C planning, etc.) ?'[]'$  - 0 ?Additional assistance / Altered mentation ?'[]'$  - 0 ?Support Surface(s) Assessment (bed, cushion, seat, etc.) ?INTERVENTIONS - Miscellaneous ?'[]'$  - External ear exam 0 ?'[]'$  - 0 ?Patient Transfer (multiple staff / Civil Service fast streamer / Similar devices) ?'[]'$  - 0 ?Simple Staple / Suture removal (25 or less) ?'[]'$  - 0 ?Complex Staple / Suture  removal (26 or more) ?'[]'$  - 0 ?Hypo/Hyperglycemic Management (do not check if billed separately) ?X- 1 15 ?Ankle / Brachial Index (ABI) - do not check if billed separately ?Has the patient been seen at the hospital within the last three years: Yes ?Total Score: 100 ?Level Of Care:  New/Established - Level ?3 ?[place], RYLINN LINZY ([id number]) ?Electronic Signature(s) ?Signed: 12/28/2021 4:22:30 PM By: Carlene Coria RN ?Entered ByCarlene Coria on 12/27/2021 09:44:49 ?Halderman, Delva H. ([id number]) ?-------------------------------------------------------------------------------- ?Encounter Discharge Information Details ?Patient Name: Mackenzie [name] ?Date of Service: 12/27/2021 8:45 AM ?Medical Record Number: [id number] ?Patient Account Number: [id number] ?Date of Birth/Sex: November 15, 1926 (88 y.o. F) ?Treating RN: Carlene Coria ?Primary Care Alva Broxson: [facility] Other Clinician: ?Referring Tetsuo Coppola: [facility] ?Treating Sarya Linenberger/Extender: Kalman Shan ?Weeks in Treatment: 0 ?Encounter Discharge Information Items Post Procedure Vitals ?Discharge Condition: Stable ?Temperature (?F): 97.5 ?Ambulatory Status: Ambulatory ?Pulse (bpm): 88 ?Discharge Destination: Home ?Respiratory Rate (breaths/min): 18 ?Transportation: Private Auto ?Blood Pressure (mmHg): 137/89 ?Accompanied By: daughter ?Schedule Follow-up Appointment: Yes ?Clinical Summary of Care: Patient Declined ?Electronic Signature(s) ?Signed: 12/28/2021 4:22:30 PM By: Carlene Coria RN ?Entered ByCarlene Coria on 12/27/2021 09:46:13 ?Decicco, Brion H. ([id number]) ?-------------------------------------------------------------------------------- ?Lower Extremity Assessment Details ?Patient Name: Mackenzie [name] ?Date of Service: 12/27/2021 8:45 AM ?Medical Record Number: [id number] ?Patient Account Number: [id number] ?Date of Birth/Sex: 02-Jan-1927 (88 y.o. F) ?Treating RN: Carlene Coria ?Primary Care Brittanee Ghazarian: [facility] Other Clinician: ?Referring Dajuan Turnley: [facility] ?Treating  Kaylaann Mountz/Extender: Kalman Shan ?Weeks in Treatment: 0 ?Edema Assessment ?Assessed: [Left: No] [Right: No] ?Edema: [Left: Ye] [Right: s] ?Calf ?Left: Right: ?Point of Measurement: 35 cm From Medial Instep 31 cm ?Ankle ?Left: Right: ?Point of Measurement: 10 cm From Medial Instep 19 cm ?Knee To Floor ?Left: Right: ?From Medial Instep 45 cm ?Vascular Assessment ?Pulses: ?Dorsalis Pedis ?Palpable: [Left:Yes] ?Blood Pressure: ?Brachial: [Left:137] ?Ankle: ?[Left:Dorsalis Pedis: 150 1.09] ?Electronic Signature(s) ?Signed: 12/28/2021 4:22:30 PM By: Carlene Coria RN ?Entered ByCarlene Coria on 12/27/2021 09:28:12 ?Granberg, Valine H. ([id number]) ?-------------------------------------------------------------------------------- ?Multi Wound Chart Details ?Patient Name: HAELEIGH, STREIFF ?Date of Service: 12/27/2021 8:45 AM ?Medical Record Number: [id number] ?Patient Account Number: [id number] ?Date of Birth/Sex: June 21, 1927 (88 y.o. F) ?Treating RN: Carlene Coria ?Primary Care Accalia Rigdon: [facility] Other Clinician: ?Referring Denny Lave: [facility] ?Treating Leandrew Keech/Extender: Kalman Shan ?Weeks in Treatment: 0 ?Vital Signs ?Height(in): 61 ?Pulse(bpm): 88 ?Weight(lbs): 136 ?Blood Pressure(mmHg): 137/89 ?Body Mass Index(BMI): 25.7 ?Temperature(??F): 97.5 ?Respiratory Rate(breaths/min): 16 ?Photos: [N/A:N/A] ?Wound Location: Left, Anterior Lower Leg N/A N/A ?Wounding Event: Trauma N/A N/A ?Primary Etiology: Trauma, Other N/A N/A ?Comorbid History: Hypertension N/A N/A ?Date Acquired: 11/23/2021 N/A N/A ?Weeks of Treatment: 0 N/A N/A ?Wound Status: Open N/A N/A ?Wound Recurrence: No N/A N/A ?Clustered Wound: Yes N/A N/A ?Measurements L x W x D (cm) 11x8x0.1 N/A N/A ?Area (cm?) : 69.115 N/A N/A ?Volume (cm?) : 6.912 N/A N/A ?Classification: Full Thickness Without Exposed N/A N/A ?Support Structures ?Exudate Amount: Medium N/A N/A ?Exudate Type: Serosanguineous N/A N/A ?Exudate Color: red, brown N/A N/A ?Granulation Amount: Large  (67-100%) N/A N/A ?Granulation Quality: Red, Pink N/A N/A ?Necrotic Amount: Small (1-33%) N/A N/A ?Exposed Structures: ?Fat Layer (Subcutaneous Tissue): N/A N/A ?Yes ?Fascia: No ?Tendon: No ?Muscle: No ?Denice Paradise

## 2022-01-03 ENCOUNTER — Encounter (HOSPITAL_BASED_OUTPATIENT_CLINIC_OR_DEPARTMENT_OTHER): Payer: Medicare PPO | Admitting: Internal Medicine

## 2022-01-03 DIAGNOSIS — L97822 Non-pressure chronic ulcer of other part of left lower leg with fat layer exposed: Secondary | ICD-10-CM

## 2022-01-03 NOTE — Progress Notes (Signed)
[name], Mackenzie HAMMER ([id number]) ?Visit Report for 01/03/2022 ?Chief Complaint Document Details ?Patient Name: Mackenzie [name], Mackenzie [name] ?Date of Service: 01/03/2022 11:15 AM ?Medical Record Number: [id number] ?Patient Account Number: [id number] ?Date of Birth/Sex: 26-Jul-1927 (88 y.o. F) ?Treating RN: Levora Dredge ?Primary Care Provider: [facility] Other Clinician: ?Referring Provider: [facility] ?Treating Provider/Extender: Kalman Shan ?Weeks in Treatment: 1 ?Information Obtained from: Patient ?Chief Complaint ?Left lower extremity wound following trauma ?Electronic Signature(s) ?Signed: 01/03/2022 11:58:04 AM By: Kalman Shan DO ?Entered By: Kalman Shan on 01/03/2022 11:54:20 ?Mackenzie [name], Mackenzie H. ([id number]) ?-------------------------------------------------------------------------------- ?Debridement Details ?Patient Name: Mackenzie [name], Mackenzie [name] ?Date of Service: 01/03/2022 11:15 AM ?Medical Record Number: [id number] ?Patient Account Number: [id number] ?Date of Birth/Sex: [birth year] (88 y.o. F) ?Treating RN: Levora Dredge ?Primary Care Provider: [facility] Other Clinician: ?Referring Provider: [facility] ?Treating Provider/Extender: Kalman Shan ?Weeks in Treatment: 1 ?Debridement Performed for ?Wound #1 Left,Anterior Lower Leg ?Assessment: ?Performed By: Physician Kalman Shan, MD ?Debridement Type: Debridement ?Level of Consciousness (Pre- ?Awake and Alert ?procedure): ?Pre-procedure Verification/Time Out ?Yes - 11:48 ?Taken: ?Total Area Debrided (L x W): 12.5 (cm) x 6.5 (cm) = 81.25 (cm?) ?Tissue and other material ?Viable, Non-Viable, Slough, Subcutaneous, Skin: Dermis , Skin: Epidermis, Slough ?debrided: ?Level: Skin/Subcutaneous Tissue ?Debridement Description: Excisional ?Instrument: Curette ?Bleeding: Minimum ?Hemostasis Achieved: Pressure ?Response to Treatment: Procedure was tolerated well ?Level of Consciousness (Post- ?Awake and Alert ?procedure): ?Post Debridement Measurements of Total  Wound ?Length: (cm) 12.5 ?Width: (cm) 6.5 ?Depth: (cm) 0.1 ?Volume: (cm?) 6.381 ?Character of Wound/Ulcer Post Debridement: Stable ?Post Procedure Diagnosis ?Same as Pre-procedure ?Electronic Signature(s) ?Signed: 01/03/2022 11:58:04 AM By: Kalman Shan DO ?Signed: 01/03/2022 4:00:22 PM By: Levora Dredge ?Entered By: Levora Dredge on 01/03/2022 11:50:47 ?Mackenzie [name], Mackenzie H. ([id number]) ?-------------------------------------------------------------------------------- ?HPI Details ?Patient Name: Mackenzie [name], Mackenzie [name] ?Date of Service: 01/03/2022 11:15 AM ?Medical Record Number: [id number] ?Patient Account Number: [id number] ?Date of Birth/Sex: [birth year] (88 y.o. F) ?Treating RN: Levora Dredge ?Primary Care Provider: [facility] Other Clinician: ?Referring Provider: [facility] ?Treating Provider/Extender: Kalman Shan ?Weeks in Treatment: 1 ?History of Present Illness ?HPI Description: Admission 12/27/2021 ?Ms. Mackenzie [name] is a 88 year old female with a past medical history of A-fib on Eliquis, CVA and hypothyroidism that presents to the clinic for a ?6-week history of wound to the left lower extremity. 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 She visited the ED for this issue. She had a hematoma and this was evacuated. She also visited her primary ?care physician for wound checks. She has been using Vaseline with dressing changes. On 3/10 she was started on Keflex for wound infection. ?Over the past 6 weeks patient reports improvement in wound healing. She currently denies signs of infection. ?01/03/2022; patient presents for follow-up. She has no issues or complaints today. She has been using Hydrofera Blue to the wound beds. She ?denies signs of infection. ?Electronic Signature(s) ?Signed: 01/03/2022 11:58:04 AM By: Kalman Shan DO ?Entered By: Kalman Shan on 01/03/2022 11:54:40 ?Mackenzie [name], Mackenzie H.  ([id number]) ?-------------------------------------------------------------------------------- ?Physical Exam Details ?Patient Name: Mackenzie [name], Mackenzie [name] ?Date of Service: 01/03/2022 11:15 AM ?Medical Record Number: [id number] ?Patient Account Number: [id number] ?Date of Birth/Sex: 1927/05/26 (88 y.o. F) ?Treating RN: Levora Dredge ?Primary Care Provider: [facility] Other Clinician: ?Referring Provider: [facility] ?Treating Provider/Extender: Kalman Shan ?Weeks in Treatment: 1 ?Constitutional ?. ?Cardiovascular ?Marland Kitchen ?Psychiatric ?Marland Kitchen ?Notes ?Left lower extremity: Multiple open wounds with granulation tissue and nonviable tissue present. No signs  of surrounding infection. No swelling on ?exam. ?Electronic Signature(s) ?Signed: 01/03/2022 11:58:04 AM By: Kalman Shan DO ?Entered By: Kalman Shan on 01/03/2022 11:54:58 ?Mackenzie [name], Mackenzie H. ([id number]) ?-------------------------------------------------------------------------------- ?Physician Orders Details ?Patient Name: Mackenzie [name], Mackenzie [name] ?Date of Service: 01/03/2022 11:15 AM ?Medical Record Number: [id number] ?Patient Account Number: [id number] ?Date of Birth/Sex: 04/21/1927 (88 y.o. F) ?Treating RN: Levora Dredge ?Primary Care Provider: [facility] Other Clinician: ?Referring Provider: [facility] ?Treating Provider/Extender: Kalman Shan ?Weeks in Treatment: 1 ?Verbal / Phone Orders: No ?Diagnosis Coding ?Follow-up Appointments ?o Return Appointment in 1 week. ?Bathing/ Shower/ Hygiene ?o May shower; gently cleanse wound with antibacterial soap, rinse and pat dry prior to dressing wounds ?Edema Control - Lymphedema / Segmental Compressive Device / Other ?o Elevate, Exercise Daily and Avoid Standing for Long Periods of Time. ?o Elevate legs to the level of the heart and pump ankles as often as possible ?o Elevate leg(s) parallel to the floor when sitting. ?Wound Treatment ?Wound #1 - Lower Leg Wound Laterality: Left, Anterior ?Cleanser: Byram Ancillary  Kit - 15 Day Supply (Generic) Every Other Day/30 Days ?Discharge Instructions: Use supplies as instructed; Kit contains: (15) Saline Bullets; (15) 3x3 Gauze; 15 pr Gloves ?Cleanser: Soap and Water Every Other Day/30 Days ?Discharge Instructions: Gently cleanse wound with antibacterial soap, rinse and pat dry prior to dressing wounds ?Topical: Triple Antibiotic Ointment, 1 (oz) Tube Every Other Day/30 Days ?Discharge Instructions: apply to smaller openings ?Primary Dressing: Hydrofera Blue Ready Transfer Foam, 2.5x2.5 (in/in) (Generic) Every Other Day/30 Days ?Discharge Instructions: Apply Hydrofera Blue Ready to wound bed as directed. Apply only to large wound ?Secondary Dressing: Gauze Every Other Day/30 Days ?Discharge Instructions: As directed: dry ?Secondary Dressing: Kerlix 4.5 x 4.1 (in/yd) (Generic) Every Other Day/30 Days ?Discharge Instructions: Apply Kerlix 4.5 x 4.1 (in/yd) as instructed ?Secured With: Medipore Tape - 59M Medipore H Soft Cloth Surgical Tape, 2x2 (in/yd) (Generic) Every Other Day/30 Days ?Electronic Signature(s) ?Signed: 01/03/2022 11:58:04 AM By: Kalman Shan DO ?Entered By: Kalman Shan on 01/03/2022 11:57:34 ?Mackenzie [name], Mackenzie H. ([id number]) ?-------------------------------------------------------------------------------- ?Problem List Details ?Patient Name: Mackenzie [name], Mackenzie [name] ?Date of Service: 01/03/2022 11:15 AM ?Medical Record Number: [id number] ?Patient Account Number: [id number] ?Date of Birth/Sex: 18-Aug-1927 (88 y.o. 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

## 2022-01-03 NOTE — Progress Notes (Signed)
Mackenzie [name], Mackenzie [name] ([id number]) ?Visit Report for 01/03/2022 ?Arrival Information Details ?Patient Name: Mackenzie [name], Mackenzie [name] ?Date of Service: 01/03/2022 11:15 AM ?Medical Record Number: [id number] ?Patient Account Number: [id number] ?Date of Birth/Sex: 08-18-1927 (88 y.o. F) ?Treating RN: Mackenzie [name] ?Primary Care Mackenzie [name]: Mackenzie [name] Other Clinician: ?Referring Mackenzie [name]: Mackenzie [name] ?Treating Mackenzie [name]/Extender: Mackenzie [name] ?Weeks in Treatment: 1 ?Visit Information History Since Last Visit ?Added or deleted any medications: No ?Patient Arrived: Mackenzie [name] ?Any new allergies or adverse reactions: No ?Arrival Time: 11:27 ?Signs or symptoms of abuse/neglect since last visito No ?Accompanied By: daughter ?Hospitalized since last visit: No ?Transfer Assistance: None ?Has Dressing in Place as Prescribed: Yes ?Patient Identification Verified: Yes ?Pain Present Now: No ?Secondary Verification Process Completed: Yes ?Patient Requires Transmission-Based Precautions: No ?Patient Has Alerts: No ?Electronic Signature(s) ?Signed: 01/03/2022 4:00:22 PM By: Mackenzie [name] ?Entered By: Mackenzie [name] on 01/03/2022 11:27:34 ?[name], [name] ([id number]) ?-------------------------------------------------------------------------------- ?Clinic Level of Care Assessment Details ?Patient Name: Mackenzie [name], Mackenzie [name] ?Date of Service: 01/03/2022 11:15 AM ?Medical Record Number: [id number] ?Patient Account Number: [id number] ?Date of Birth/Sex: [birth year] (88 y.o. F) ?Treating RN: Mackenzie [name] ?Primary Care Mackenzie [name]: Mackenzie [name] Other Clinician: ?Referring Mackenzie [name]: Mackenzie [name] ?Treating Mackenzie [name]/Extender: Mackenzie [name] ?Weeks in Treatment: 1 ?Clinic Level of Care Assessment Items ?TOOL 1 Quantity Score ?'[]'$  - Use when EandM and Procedure is performed on INITIAL visit 0 ?ASSESSMENTS - Nursing Assessment / Reassessment ?'[]'$  - General Physical Exam (combine w/ comprehensive assessment (listed just below) when performed on new ?0 ?pt. evals) ?'[]'$  -  0 ?Comprehensive Assessment (HX, ROS, Risk Assessments, Wounds Hx, etc.) ?ASSESSMENTS - Wound and Skin Assessment / Reassessment ?'[]'$  - Dermatologic / Skin Assessment (not related to wound area) 0 ?ASSESSMENTS - Ostomy and/or Continence Assessment and Care ?'[]'$  - Incontinence Assessment and Management 0 ?'[]'$  - 0 ?Ostomy Care Assessment and Management (repouching, etc.) ?PROCESS - Coordination of Care ?'[]'$  - Simple Patient / Family Education for ongoing care 0 ?'[]'$  - 0 ?Complex (extensive) Patient / Family Education for ongoing care ?'[]'$  - 0 ?Staff obtains Consents, Records, Test Results / Process Orders ?'[]'$  - 0 ?Staff telephones HHA, Nursing Homes / Clarify orders / etc ?'[]'$  - 0 ?Routine Transfer to another Facility (non-emergent condition) ?'[]'$  - 0 ?Routine Hospital Admission (non-emergent condition) ?'[]'$  - 0 ?New Admissions / Biomedical engineer / Ordering NPWT, Apligraf, etc. ?'[]'$  - 0 ?Emergency Hospital Admission (emergent condition) ?PROCESS - Special Needs ?'[]'$  - Pediatric / Minor Patient Management 0 ?'[]'$  - 0 ?Isolation Patient Management ?'[]'$  - 0 ?Hearing / Language / Visual special needs ?'[]'$  - 0 ?Assessment of Community assistance (transportation, D/C planning, etc.) ?'[]'$  - 0 ?Additional assistance / Altered mentation ?'[]'$  - 0 ?Support Surface(s) Assessment (bed, cushion, seat, etc.) ?INTERVENTIONS - Miscellaneous ?'[]'$  - External ear exam 0 ?'[]'$  - 0 ?Patient Transfer (multiple staff / Civil Service fast streamer / Similar devices) ?'[]'$  - 0 ?Simple Staple / Suture removal (25 or less) ?'[]'$  - 0 ?Complex Staple / Suture removal (26 or more) ?'[]'$  - 0 ?Hypo/Hyperglycemic Management (do not check if billed separately) ?'[]'$  - 0 ?Ankle / Brachial Index (ABI) - do not check if billed separately ?Has the patient been seen at the hospital within the last three years: Yes ?Total Score: 0 ?Level Of Care: ____ ?[name], [name] ?Electronic Signature(s) ?Signed: 01/03/2022 4:00:22 PM By: Mackenzie [name] ?Entered By: Mackenzie [name] on  01/03/2022 12:04:08 ?[name], [name] ?-------------------------------------------------------------------------------- ?Encounter Discharge Information Details ?Patient Name: Mackenzie [name], Mackenzie [name] ?Date of Service: 01/03/2022 11:15 AM ?Medical Record Number:  [id number] ?Patient Account Number: [id number] ?Date of Birth/Sex: July 07, 1927 (88 y.o. F) ?Treating RN: Mackenzie [name] ?Primary Care Mackenzie [name]: Mackenzie [name] Other Clinician: ?Referring Mackenzie [name]: Mackenzie [name] ?Treating Mackenzie [name]/Extender: Mackenzie [name] ?Weeks in Treatment: 1 ?Encounter Discharge Information Items Post Procedure Vitals ?Discharge Condition: Stable ?Temperature (?F): 97.4 ?Ambulatory Status: Mackenzie [name] ?Pulse (bpm): 86 ?Discharge Destination: Home ?Respiratory Rate (breaths/min): 18 ?Transportation: Private Auto ?Blood Pressure (mmHg): 157/88 ?Accompanied By: daughter ?Schedule Follow-up Appointment: Yes ?Clinical Summary of Care: ?Electronic Signature(s) ?Signed: 01/03/2022 12:05:22 PM By: Mackenzie [name] ?Entered By: Mackenzie [name] on 01/03/2022 12:05:21 ?Mackenzie [name], Mackenzie H. ([id number]) ?-------------------------------------------------------------------------------- ?Lower Extremity Assessment Details ?Patient Name: Mackenzie [name], Mackenzie [name] ?Date of Service: 01/03/2022 11:15 AM ?Medical Record Number: [id number] ?Patient Account Number: [id number] ?Date of Birth/Sex: 03-Jan-1927 (88 y.o. F) ?Treating RN: Mackenzie [name] ?Primary Care Dandrea Medders: Mackenzie [name] Other Clinician: ?Referring Jiali Linney: Mackenzie [name] ?Treating Mackenzie [name]/Extender: Mackenzie [name] ?Weeks in Treatment: 1 ?Edema Assessment ?Assessed: [Left: No] [Right: No] ?Edema: [Left: N] [Right: o] ?Calf ?Left: Right: ?Point of Measurement: 35 cm From Medial Instep 32.2 cm ?Ankle ?Left: Right: ?Point of Measurement: 10 cm From Medial Instep 20 cm ?Vascular Assessment ?Pulses: ?Dorsalis Pedis ?Palpable: [Left:Yes] ?Electronic Signature(s) ?Signed: 01/03/2022 4:00:22 PM By: Mackenzie [name] ?Entered By:  Mackenzie [name] on 01/03/2022 11:44:40 ?Mackenzie [name], Mackenzie H. ([id number]) ?-------------------------------------------------------------------------------- ?Multi Wound Chart Details ?Patient Name: Mackenzie [name], Mackenzie [name] ?Date of Service: 01/03/2022 11:15 AM ?Medical Record Number: [id number] ?Patient Account Number: [id number] ?Date of Birth/Sex: Jul 26, 1927 (88 y.o. F) ?Treating RN: Mackenzie [name] ?Primary Care Shanera Meske: Mackenzie [name] Other Clinician: ?Referring Byrant Valent: Mackenzie [name] ?Treating Loretta Doutt/Extender: Mackenzie [name] ?Weeks in Treatment: 1 ?Vital Signs ?Height(in): 61 ?Pulse(bpm): 86 ?Weight(lbs): 136 ?Blood Pressure(mmHg): 157/88 ?Body Mass Index(BMI): 25.7 ?Temperature(??F): 97.4 ?Respiratory Rate(breaths/min): 18 ?Photos: [N/A:N/A] ?Wound Location: Left, Anterior Lower Leg N/A N/A ?Wounding Event: Trauma N/A N/A ?Primary Etiology: Trauma, Other N/A N/A ?Comorbid History: Hypertension N/A N/A ?Date Acquired: 11/23/2021 N/A N/A ?Weeks of Treatment: 1 N/A N/A ?Wound Status: Open N/A N/A ?Wound Recurrence: No N/A N/A ?Clustered Wound: Yes N/A N/A ?Measurements L x W x D (cm) 12.5x6.5x0.1 N/A N/A ?Area (cm?) : 63.814 N/A N/A ?Volume (cm?) : 6.381 N/A N/A ?% Reduction in Area: 7.70% N/A N/A ?% Reduction in Volume: 7.70% N/A N/A ?Classification: Full Thickness Without Exposed N/A N/A ?Support Structures ?Exudate Amount: Medium N/A N/A ?Exudate Type: Serosanguineous N/A N/A ?Exudate Color: red, brown N/A N/A ?Granulation Amount: Large (67-100%) N/A N/A ?Granulation Quality: Red, Pink N/A N/A ?Necrotic Amount: Small (1-33%) N/A N/A ?Exposed Structures: ?Fat Layer (Subcutaneous Tissue): N/A N/A ?Yes ?Fascia: No ?Tendon: No ?Muscle: No ?Joint: No ?Bone: No ?Epithelialization: None N/A N/A ?Treatment Notes ?Electronic Signature(s) ?Signed: 01/03/2022 4:00:22 PM By: Mackenzie [name] ?Entered By: Mackenzie [name] on 01/03/2022 11:48:02 ?Mackenzie [name], Mackenzie H.  ([id number]) ?-------------------------------------------------------------------------------- ?Multi-Disciplinary Care Plan Details ?Patient Name: MECHELE, KITTLESON ?Date of Service: 01/03/2022 11:15 AM ?Medical Record Number: [id number] ?Patient Account Number: [id number] ?Date of Birth/Sex: [birth year]

## 2022-01-10 ENCOUNTER — Encounter: Payer: Medicare PPO | Attending: Internal Medicine | Admitting: Internal Medicine

## 2022-01-10 DIAGNOSIS — Z87891 Personal history of nicotine dependence: Secondary | ICD-10-CM | POA: Insufficient documentation

## 2022-01-10 DIAGNOSIS — Z7901 Long term (current) use of anticoagulants: Secondary | ICD-10-CM

## 2022-01-10 DIAGNOSIS — X58XXXA Exposure to other specified factors, initial encounter: Secondary | ICD-10-CM | POA: Insufficient documentation

## 2022-01-10 DIAGNOSIS — I4891 Unspecified atrial fibrillation: Secondary | ICD-10-CM | POA: Diagnosis not present

## 2022-01-10 DIAGNOSIS — T798XXA Other early complications of trauma, initial encounter: Secondary | ICD-10-CM

## 2022-01-10 DIAGNOSIS — L97822 Non-pressure chronic ulcer of other part of left lower leg with fat layer exposed: Secondary | ICD-10-CM

## 2022-01-10 DIAGNOSIS — I10 Essential (primary) hypertension: Secondary | ICD-10-CM | POA: Diagnosis not present

## 2022-01-10 NOTE — Progress Notes (Signed)
Ratay, SONOMA FIRKUS ([id number]) ?Visit Report for 01/10/2022 ?Chief Complaint Document Details ?Patient Name: Mackenzie [name], Mackenzie [name] ?Date of Service: 01/10/2022 12:30 PM ?Medical Record Number: [id number] ?Patient Account Number: [id number] ?Date of Birth/Sex: 1927/03/18 (88 y.o. F) ?Treating RN: Carlene Coria ?Primary Care Provider: Ramonita Lab Other Clinician: ?Referring Provider: Ramonita Lab ?Treating Provider/Extender: Kalman Shan ?Weeks in Treatment: 2 ?Information Obtained from: Patient ?Chief Complaint ?Left lower extremity wound following trauma ?Electronic Signature(s) ?Signed: 01/10/2022 3:15:14 PM By: Kalman Shan DO ?Entered By: Kalman Shan on 01/10/2022 13:08:18 ?Thomure, Rebakah H. ([id number]) ?-------------------------------------------------------------------------------- ?HPI Details ?Patient Name: Mackenzie [name], Mackenzie [name] ?Date of Service: 01/10/2022 12:30 PM ?Medical Record Number: [id number] ?Patient Account Number: [id number] ?Date of Birth/Sex: 1926/11/14 (88 y.o. F) ?Treating RN: Carlene Coria ?Primary Care Provider: Ramonita Lab Other Clinician: ?Referring Provider: Ramonita Lab ?Treating Provider/Extender: Kalman Shan ?Weeks in Treatment: 2 ?History of Present Illness ?HPI Description: Admission 12/27/2021 ?Ms. Monalisa Bayless is a 88 year old female with a past medical history of A-fib on Eliquis, CVA and hypothyroidism that presents to the clinic for a ?6-week history of wound to the left lower extremity. 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 She visited the ED for this issue. She had a hematoma and this was evacuated. She also visited her primary ?care physician for wound checks. She has been using Vaseline with dressing changes. On 3/10 she was started on Keflex for wound infection. ?Over the past 6 weeks patient reports improvement in wound healing. She currently denies signs of infection. ?01/03/2022; patient  presents for follow-up. She has no issues or complaints today. She has been using Hydrofera Blue to the wound beds. She ?denies signs of infection. ?4/5; patient presents for follow-up. She has been using Hydrofera Blue to the wound bed without issues. She is been using antibiotic ointment to ?the scattered wound beds with improvement in healing. She has no issues or complaints today. She denies signs of infection. ?Electronic Signature(s) ?Signed: 01/10/2022 3:15:14 PM By: Kalman Shan DO ?Entered By: Kalman Shan on 01/10/2022 13:08:48 ?Dahm, Katarzyna H. ([id number]) ?-------------------------------------------------------------------------------- ?Physical Exam Details ?Patient Name: Mackenzie [name], Mackenzie [name] ?Date of Service: 01/10/2022 12:30 PM ?Medical Record Number: [id number] ?Patient Account Number: [id number] ?Date of Birth/Sex: [birth year] (88 y.o. F) ?Treating RN: Carlene Coria ?Primary Care Provider: Ramonita Lab Other Clinician: ?Referring Provider: Ramonita Lab ?Treating Provider/Extender: Kalman Shan ?Weeks in Treatment: 2 ?Constitutional ?. ?Cardiovascular ?Marland Kitchen ?Psychiatric ?Marland Kitchen ?Notes ?Left lower extremity: 1 open wound with granulation tissue and scant nonviable tissue present. The surrounding skin is intact. Previous wound ?sites are epithelialized. No signs of infection. ?Electronic Signature(s) ?Signed: 01/10/2022 3:15:14 PM By: Kalman Shan DO ?Entered By: Kalman Shan on 01/10/2022 13:09:32 ?Brunelle, Kechia H. ([id number]) ?-------------------------------------------------------------------------------- ?Physician Orders Details ?Patient Name: Mackenzie [name], Mackenzie [name] ?Date of Service: 01/10/2022 12:30 PM ?Medical Record Number: [id number] ?Patient Account Number: [id number] ?Date of Birth/Sex: January 31, 1927 (88 y.o. F) ?Treating RN: Carlene Coria ?Primary Care Provider: Ramonita Lab Other Clinician: ?Referring Provider: Ramonita Lab ?Treating Provider/Extender: Kalman Shan ?Weeks in Treatment: 2 ?Verbal / Phone  Orders: No ?Diagnosis Coding ?Follow-up Appointments ?o Return Appointment in 1 week. ?Bathing/ Shower/ Hygiene ?o May shower; gently cleanse wound with antibacterial soap, rinse and pat dry prior to dressing wounds ?Edema Control - Lymphedema / Segmental Compressive Device / Other ?o Elevate, Exercise Daily and Avoid Standing for Long Periods of Time. ?o Elevate legs to the level of the heart and pump ankles  as often as possible ?o Elevate leg(s) parallel to the floor when sitting. ?Wound Treatment ?Wound #1 - Lower Leg Wound Laterality: Left, Anterior ?Cleanser: Byram Ancillary Kit - 15 Day Supply (Generic) Every Other Day/30 Days ?Discharge Instructions: Use supplies as instructed; Kit contains: (15) Saline Bullets; (15) 3x3 Gauze; 15 pr Gloves ?Cleanser: Soap and Water Every Other Day/30 Days ?Discharge Instructions: Gently cleanse wound with antibacterial soap, rinse and pat dry prior to dressing wounds ?Topical: Triple Antibiotic Ointment, 1 (oz) Tube Every Other Day/30 Days ?Discharge Instructions: apply to smaller openings ?Primary Dressing: Hydrofera Blue Ready Transfer Foam, 2.5x2.5 (in/in) (Generic) Every Other Day/30 Days ?Discharge Instructions: Apply Hydrofera Blue Ready to wound bed as directed. Apply only to large wound ?Secondary Dressing: Gauze Every Other Day/30 Days ?Discharge Instructions: As directed: dry ?Secondary Dressing: Kerlix 4.5 x 4.1 (in/yd) (Generic) Every Other Day/30 Days ?Discharge Instructions: Apply Kerlix 4.5 x 4.1 (in/yd) as instructed ?Secured With: Medipore Tape - 52M Medipore H Soft Cloth Surgical Tape, 2x2 (in/yd) (Generic) Every Other Day/30 Days ?Electronic Signature(s) ?Signed: 01/10/2022 3:15:14 PM By: Kalman Shan DO ?Entered By: Kalman Shan on 01/10/2022 13:11:19 ?Benefiel, Quantia H. ([id number]) ?-------------------------------------------------------------------------------- ?Problem List Details ?Patient Name: Mackenzie [name], Mackenzie [name] ?Date of Service:  01/10/2022 12:30 PM ?Medical Record Number: [id number] ?Patient Account Number: [id number] ?Date of Birth/Sex: [birth year] (88 y.o. 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 ([id number]) ?-------------------------------------------------------------------------------- ?Progress Note Details ?Patient Name: Mackenzie [name], Mackenzie [name] ?Date of Service: 01/10/2022 12:30 PM ?Medical Record Number: [id number] ?Patient Account Number: [id number] ?Date of Birth/Sex: [birth year] (88 y.o. F) ?Treating RN: Carlene Coria ?Primary Care Provider: Ramonita Lab Other Clinician: ?Referring Provider: Ramonita Lab ?Treating Provider/Extender: Kalman Shan ?Weeks in Treatment: 2 ?Subjective ?Chief Complaint ?Information obtained from Patient ?Left lower extremity wound following trauma ?History of Present Illness (HPI) ?Admission 12/27/2021 ?Ms. Emerie Vanderkolk is a 88 year old female with a past medical history of A-fib on Eliquis, CVA and hypothyroidism that presents to the clinic for a ?6-week history of wound to the left lower extremity. 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 She visited the ED for this issue. She had a hematoma and this was  evacuated. She also visited her primary ?care physician for wound checks. She has been using Vaseline with dressing changes. On 3/10 she was started on Keflex for wound infection. ?Over the past 6 weeks patient re

## 2022-01-11 NOTE — Progress Notes (Signed)
Mackenzie [name] ([id number]) ?Visit Report for 01/10/2022 ?Arrival Information Details ?Patient Name: Mackenzie [name], Mackenzie [name] ?Date of Service: 01/10/2022 12:30 PM ?Medical Record Number: [id number] ?Patient Account Number: [id number] ?Date of Birth/Sex: Apr 22, 1927 (88 y.o. F) ?Treating RN: Mackenzie [name] ?Primary Care Mackenzie [name]: Mackenzie [name] Other Clinician: ?Referring Mackenzie [name]: Mackenzie [name] ?Treating Mackenzie [name]/Extender: Mackenzie [name] ?Weeks in Treatment: 2 ?Visit Information History Since Last Visit ?All ordered tests and consults were completed: No ?Patient Arrived: Ambulatory ?Added or deleted any medications: No ?Arrival Time: 12:35 ?Any new allergies or adverse reactions: No ?Accompanied By: daughter ?Had a fall or experienced change in No ?Transfer Assistance: None ?activities of daily living that may affect ?Patient Identification Verified: Yes ?risk of falls: ?Secondary Verification Process Completed: Yes ?Signs or symptoms of abuse/neglect since last visito No ?Patient Requires Transmission-Based Precautions: No ?Hospitalized since last visit: No ?Patient Has Alerts: No ?Implantable device outside of the clinic excluding No ?cellular tissue based products placed in the center ?since last visit: ?Has Dressing in Place as Prescribed: Yes ?Pain Present Now: No ?Electronic Signature(s) ?Signed: 01/11/2022 9:23:23 AM By: Mackenzie Coria RN ?Entered ByCarlene [name] on 01/10/2022 12:39:45 ?Gellerman, Mackenzie H. ([id number]) ?-------------------------------------------------------------------------------- ?Clinic Level of Care Assessment Details ?Patient Name: Mackenzie [name] ?Date of Service: 01/10/2022 12:30 PM ?Medical Record Number: [id number] ?Patient Account Number: [id number] ?Date of Birth/Sex: 20-Sep-1927 (88 y.o. F) ?Treating RN: Mackenzie [name] ?Primary Care Avish Torry: Mackenzie [name] Other Clinician: ?Referring Kamilah Correia: Mackenzie [name] ?Treating Coren Crownover/Extender: Mackenzie [name] ?Weeks in Treatment: 2 ?Clinic Level of Care Assessment  Items ?TOOL 4 Quantity Score ?X - Use when only an EandM is performed on FOLLOW-UP visit 1 0 ?ASSESSMENTS - Nursing Assessment / Reassessment ?X - Reassessment of Co-morbidities (includes updates in patient status) 1 10 ?X- 1 5 ?Reassessment of Adherence to Treatment Plan ?ASSESSMENTS - Wound and Skin Assessment / Reassessment ?X - Simple Wound Assessment / Reassessment - one wound 1 5 ?'[]'$  - 0 ?Complex Wound Assessment / Reassessment - multiple wounds ?'[]'$  - 0 ?Dermatologic / Skin Assessment (not related to wound area) ?ASSESSMENTS - Focused Assessment ?'[]'$  - Circumferential Edema Measurements - multi extremities 0 ?'[]'$  - 0 ?Nutritional Assessment / Counseling / Intervention ?'[]'$  - 0 ?Lower Extremity Assessment (monofilament, tuning fork, pulses) ?'[]'$  - 0 ?Peripheral Arterial Disease Assessment (using hand held doppler) ?ASSESSMENTS - Ostomy and/or Continence Assessment and Care ?'[]'$  - Incontinence Assessment and Management 0 ?'[]'$  - 0 ?Ostomy Care Assessment and Management (repouching, etc.) ?PROCESS - Coordination of Care ?X - Simple Patient / Family Education for ongoing care 1 15 ?'[]'$  - 0 ?Complex (extensive) Patient / Family Education for ongoing care ?'[]'$  - 0 ?Staff obtains Consents, Records, Test Results / Process Orders ?'[]'$  - 0 ?Staff telephones HHA, Nursing Homes / Clarify orders / etc ?'[]'$  - 0 ?Routine Transfer to another Facility (non-emergent condition) ?'[]'$  - 0 ?Routine Hospital Admission (non-emergent condition) ?'[]'$  - 0 ?New Admissions / Biomedical engineer / Ordering NPWT, Apligraf, etc. ?'[]'$  - 0 ?Emergency Hospital Admission (emergent condition) ?X- 1 10 ?Simple Discharge Coordination ?'[]'$  - 0 ?Complex (extensive) Discharge Coordination ?PROCESS - Special Needs ?'[]'$  - Pediatric / Minor Patient Management 0 ?'[]'$  - 0 ?Isolation Patient Management ?'[]'$  - 0 ?Hearing / Language / Visual special needs ?'[]'$  - 0 ?Assessment of Community assistance (transportation, D/C planning, etc.) ?'[]'$  - 0 ?Additional assistance /  Altered mentation ?'[]'$  - 0 ?Support Surface(s) Assessment (bed, cushion, seat, etc.) ?INTERVENTIONS - Wound Cleansing / Measurement ?Mackenzie [name], Mackenzie GOODRIDGE ([id number]) ?X- 1 5 ?Simple Wound Cleansing -  one wound ?'[]'$  - 0 ?Complex Wound Cleansing - multiple wounds ?X- 1 5 ?Wound Imaging (photographs - any number of wounds) ?'[]'$  - 0 ?Wound Tracing (instead of photographs) ?X- 1 5 ?Simple Wound Measurement - one wound ?'[]'$  - 0 ?Complex Wound Measurement - multiple wounds ?INTERVENTIONS - Wound Dressings ?X - Small Wound Dressing one or multiple wounds 1 10 ?'[]'$  - 0 ?Medium Wound Dressing one or multiple wounds ?'[]'$  - 0 ?Large Wound Dressing one or multiple wounds ?'[]'$  - 0 ?Application of Medications - topical ?'[]'$  - 0 ?Application of Medications - injection ?INTERVENTIONS - Miscellaneous ?'[]'$  - External ear exam 0 ?'[]'$  - 0 ?Specimen Collection (cultures, biopsies, blood, body fluids, etc.) ?'[]'$  - 0 ?Specimen(s) / Culture(s) sent or taken to [name] for analysis ?'[]'$  - 0 ?Patient Transfer (multiple staff / Civil Service fast streamer / Similar devices) ?'[]'$  - 0 ?Simple Staple / Suture removal (25 or less) ?'[]'$  - 0 ?Complex Staple / Suture removal (26 or more) ?'[]'$  - 0 ?Hypo / Hyperglycemic Management (close monitor of Blood Glucose) ?'[]'$  - 0 ?Ankle / Brachial Index (ABI) - do not check if billed separately ?X- 1 5 ?Vital Signs ?Has the patient been seen at the hospital within the last three years: Yes ?Total Score: 75 ?Level Of Care: New/Established - Level ?2 ?Electronic Signature(s) ?Signed: 01/11/2022 9:23:23 AM By: Mackenzie Coria RN ?Entered By: Mackenzie [name] on 01/10/2022 16:12:33 ?Goodridge, Mackenzie H. ([id number]) ?-------------------------------------------------------------------------------- ?Lower Extremity Assessment Details ?Patient Name: Mackenzie [name], Mackenzie [name] ?Date of Service: 01/10/2022 12:30 PM ?Medical Record Number: [id number] ?Patient Account Number: [id number] ?Date of Birth/Sex: [birth year] (88 y.o. F) ?Treating RN: Mackenzie [name] ?Primary Care Vernal Rutan: Mackenzie [name] Other Clinician: ?Referring Lakresha Stifter: Mackenzie [name] ?Treating Tobenna Needs/Extender: Mackenzie [name] ?Weeks in Treatment: 2 ?Edema Assessment ?Assessed: [Left: No] [Right: No] ?Edema: [Left: Ye] [Right: s] ?Calf ?Left: Right: ?Point of Measurement: 35 cm From Medial Instep 32 cm ?Ankle ?Left: Right: ?Point of Measurement: 10 cm From Medial Instep 18 cm ?Vascular Assessment ?Pulses: ?Dorsalis Pedis ?Palpable: [Left:Yes] ?Electronic Signature(s) ?Signed: 01/11/2022 9:23:23 AM By: Mackenzie Coria RN ?Entered By: Mackenzie [name] on 01/10/2022 12:45:31 ?Mackenzie [name], Mackenzie H. ([id number]) ?-------------------------------------------------------------------------------- ?Multi Wound Chart Details ?Patient Name: Mackenzie [name], Mackenzie [name] ?Date of Service: 01/10/2022 12:30 PM ?Medical Record Number: [id number] ?Patient Account Number: [id number] ?Date of Birth/Sex: [birth year] (88 y.o. F) ?Treating RN: Mackenzie [name] ?Primary Care Ammar Moffatt: Mackenzie [name] Other Clinician: ?Referring Reginal Wojcicki: Mackenzie [name] ?Treating Kaytlynne Neace/Extender: Mackenzie [name] ?Weeks in Treatment: 2 ?Vital Signs ?Height(in): 61 ?Pulse(bpm): 76 ?Weight(lbs): 136 ?Blood Pressure(mmHg): 148/81 ?Body Mass Index(BMI): 25.7 ?Temperature(??F): 97.6 ?Respiratory Rate(breaths/min): 18 ?Photos: [N/A:N/A] ?Wound Location: Left, Anterior Lower Leg N/A N/A ?Wounding Event: Trauma N/A N/A ?Primary Etiology: Trauma, Other N/A N/A ?Comorbid History: Hypertension N/A N/A ?Date Acquired: 11/23/2021 N/A N/A ?Weeks of Treatment: 2 N/A N/A ?Wound Status: Open N/A N/A ?Wound Recurrence: No N/A N/A ?Clustered Wound: Yes N/A N/A ?Measurements L x W x D (cm) 3x1.5x0.1 N/A N/A ?Area (cm?) : 3.534 N/A N/A ?Volume (cm?) : 0.353 N/A N/A ?% Reduction in Area: 94.90% N/A N/A ?% Reduction in Volume: 94.90% N/A N/A ?Classification: Full Thickness Without Exposed N/A N/A ?Support Structures ?Exudate Amount: Medium N/A N/A ?Exudate Type: Serosanguineous N/A N/A ?Exudate Color: red, brown N/A N/A ?Granulation Amount:  Large (67-100%) N/A N/A ?Granulation Quality: Red, Pink N/A N/A ?Necrotic Amount: None Present (0%) N/A N/A ?Exposed Structures: ?Fat Layer (Subcutaneous Tissue): N/A N/A ?Yes ?Fascia: No ?Tendon: No ?Muscle: N

## 2022-01-17 ENCOUNTER — Encounter: Payer: Medicare PPO | Admitting: Internal Medicine

## 2022-01-17 DIAGNOSIS — L97822 Non-pressure chronic ulcer of other part of left lower leg with fat layer exposed: Secondary | ICD-10-CM | POA: Diagnosis not present

## 2022-01-23 NOTE — Progress Notes (Signed)
Husmann, Chantavia H. ([id number]) ?Visit Report for 01/17/2022 ?HPI Details ?Patient Name: Mackenzie [name], Mackenzie H. ?Date of Service: 01/17/2022 12:30 PM ?Medical Record Number: [id number] ?Patient Account Number: [id number] ?Date of Birth/Sex: 10/05/1927 (88 y.o. F) ?Treating RN: Epps, Carrie ?Primary Care Provider: Klein, Bert Other Clinician: ?Referring Provider: Klein, Bert ?Treating Provider/Extender: ROBSON, MICHAEL G ?Weeks in Treatment: 3 ?History of Present Illness ?HPI Description: Admission 12/27/2021 ?Ms. Mackenzie [name] is a 88-year-old female with a past medical history of A-fib on Eliquis, CVA and hypothyroidism that presents to the clinic for a ?6-week history of wound to the left lower extremity. She states she was getting into her church van when she fell and scraped her leg against one ?of the van steps creating a skin tear. She visited the ED for this issue. She had a hematoma and this was evacuated. She also visited her primary ?care physician for wound checks. She has been using Vaseline with dressing changes. On 3/10 she was started on Keflex for wound infection. ?Over the past 6 weeks patient reports improvement in wound healing. She currently denies signs of infection. ?01/03/2022; patient presents for follow-up. She has no issues or complaints today. She has been using Hydrofera Blue to the wound beds. She ?denies signs of infection. ?4/5; patient presents for follow-up. She has been using Hydrofera Blue to the wound bed without issues. She is been using antibiotic ointment to ?the scattered wound beds with improvement in healing. She has no issues or complaints today. She denies signs of infection. ?4/12; patient using Hydrofera Blue kerlix. Wound is much better smaller healthy granulation. ?Electronic Signature(s) ?Signed: 01/18/2022 7:47:50 AM By: Robson, Michael MD ?Entered By: Robson, Michael on 01/17/2022 12:55:39 ?Chartrand, Katonya H.  ([id number]) ?-------------------------------------------------------------------------------- ?Physical Exam Details ?Patient Name: [name], [name] ?Date of Service: 01/17/2022 12:30 PM ?Medical Record Number: [id number] ?Patient Account Number: [id number] ?Date of Birth/Sex: 07/19/1927 (88 y.o. F) ?Treating RN: Epps, Carrie ?Primary Care Provider: Klein, Bert Other Clinician: ?Referring Provider: Klein, Bert ?Treating Provider/Extender: ROBSON, MICHAEL G ?Weeks in Treatment: 3 ?Constitutional ?Patient is hypertensive.. Pulse regular and within target range for patient.. Respirations regular, non-labored and within target range.. ?Temperature is normal and within the target range for the patient.. appears in no distress. ?Notes ?Wound exam; left lower extremity. Superficial wound however healthy granulation. Rims of epithelialization. Dimensions are much better. She has ?some skin changes suggestive of chronic venous hypertension/hemosiderin deposition. Not a lot of edema ?Electronic Signature(s) ?Signed: 01/18/2022 7:47:50 AM By: Robson, Michael MD ?Entered By: Robson, Michael on 01/17/2022 12:57:01 ?Farino, Kirby H. ([id number]) ?-------------------------------------------------------------------------------- ?Physician Orders Details ?Patient Name: [name], Mackenzie H. ?Date of Service: 01/17/2022 12:30 PM ?Medical Record Number: [id number] ?Patient Account Number: [id number] ?Date of Birth/Sex: 09/09/1927 (88 y.o. F) ?Treating RN: Epps, Carrie ?Primary Care Provider: Klein, Bert Other Clinician: ?Referring Provider: Klein, Bert ?Treating Provider/Extender: ROBSON, MICHAEL G ?Weeks in Treatment: 3 ?Verbal / Phone Orders: No ?Diagnosis Coding ?Follow-up Appointments ?o Return Appointment in 1 week. ?Bathing/ Shower/ Hygiene ?o May shower; gently cleanse wound with antibacterial soap, rinse and pat dry prior to dressing wounds ?Edema Control - Lymphedema / Segmental Compressive Device / Other ?o Elevate, Exercise Daily  and Avoid Standing for Long Periods of Time. ?o Elevate legs to the level of the heart and pump ankles as often as possible ?o Elevate leg(s) parallel to the floor when sitting. ?Wound Treatment ?Wound #1 - Lower Leg Wound Laterality: Left, Anterior ?Cleanser: Byram Ancillary Kit - 15 Day Supply (Generic) Every Other Day/30 Days ?Discharge   Instructions: Use supplies as instructed; Kit contains: (15) Saline Bullets; (15) 3x3 Gauze; 15 pr Gloves ?Cleanser: Soap and Water Every Other Day/30 Days ?Discharge Instructions: Gently cleanse wound with antibacterial soap, rinse and pat dry prior to dressing wounds ?Topical: Triple Antibiotic Ointment, 1 (oz) Tube Every Other Day/30 Days ?Discharge Instructions: apply to smaller openings ?Primary Dressing: Hydrofera Blue Ready Transfer Foam, 2.5x2.5 (in/in) (Generic) Every Other Day/30 Days ?Discharge Instructions: Apply Hydrofera Blue Ready to wound bed as directed. Apply only to large wound ?Secondary Dressing: Gauze Every Other Day/30 Days ?Discharge Instructions: As directed: dry ?Secondary Dressing: Kerlix 4.5 x 4.1 (in/yd) (Generic) Every Other Day/30 Days ?Discharge Instructions: Apply Kerlix 4.5 x 4.1 (in/yd) as instructed ?Secured With: Medipore Tape - 66M Medipore H Soft Cloth Surgical Tape, 2x2 (in/yd) (Generic) Every Other Day/30 Days ?Electronic Signature(s) ?Signed: 01/18/2022 7:47:50 AM By: Linton Ham MD ?Signed: 01/23/2022 9:16:22 AM By: Carlene Coria RN ?Entered By: Carlene Coria on 01/17/2022 12:49:28 ?Garciamartinez, Wynonia Musty ([id number]) ?-------------------------------------------------------------------------------- ?Problem List Details ?Patient Name: [name], Mackenzie ?Date of Service: 01/17/2022 12:30 PM ?Medical Record Number: [id number] ?Patient Account Number: [id number] ?Date of Birth/Sex: 07-15-1927 (88 y.o. 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 ([id number]) ?-------------------------------------------------------------------------------- ?Progress Note Details ?Patient Name: MALENI, SEYER ?Date of Service: 01/17/2022 12:30 PM ?Medical Record Number: [id number] ?Patient Account Number: [id number] ?Date of Birth/Sex: May 26, 1927 (88 y.o. F) ?Treating RN: Carlene Coria ?Primary Care Provider: Ramonita Lab Other Clinician: ?Referring Provider: Ramonita Lab ?Treating Provider/Extender: Ricard Dillon ?Weeks in Treatment: 3 ?Subjective ?History of Present Illness (HPI) ?Admission 12/27/2021 ?Ms. Morgana Rowley is a 88 year old female with a past medical history of A-fib on Eliquis, CVA and hypothyroidism that presents to the clinic for a ?6-week history of wound to the left lower extremity. 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 She visited the ED for this issue. She had a hematoma and this was evacuated. She also visited her primary ?care physician for wound checks. She has been using Vaseline with dressing changes. On 3/10 she was started on Keflex for wound infection. ?Over the past 6 weeks patient reports improvement in wound healing. She currently denies signs of infection. ?01/03/2022; patient presents for follow-up. She has no issues or complaints today.  She has been using Hydrofera Blue to the wound beds. She ?denies signs of infection. ?4/5; patient presents for follow-up. She has been using Hydrofera Blue to the wound bed without issues. She is been using antibiotic ointment to ?the scattered wound beds with improvement in

## 2022-01-23 NOTE — Progress Notes (Signed)
Mackenzie [name] ([id number]) ?Visit Report for 01/17/2022 ?Arrival Information Details ?Patient Name: Mackenzie [name], Mackenzie [name] ?Date of Service: 01/17/2022 12:30 PM ?Medical Record Number: [id number] ?Patient Account Number: [id number] ?Date of Birth/Sex: 19-Jun-1927 (88 y.o. F) ?Treating RN: Carlene Coria ?Primary Care Addalee Kavanagh: [facility] Other Clinician: ?Referring Slayter Moorhouse: [facility] ?Treating Malikye Reppond/Extender: Ricard Dillon ?Weeks in Treatment: 3 ?Visit Information History Since Last Visit ?All ordered tests and consults were completed: No ?Patient Arrived: Ambulatory ?Added or deleted any medications: No ?Arrival Time: 12:35 ?Any new allergies or adverse reactions: No ?Accompanied By: daughter ?Had a fall or experienced change in No ?Transfer Assistance: None ?activities of daily living that may affect ?Patient Identification Verified: Yes ?risk of falls: ?Secondary Verification Process Completed: Yes ?Signs or symptoms of abuse/neglect since last visito No ?Patient Requires Transmission-Based Precautions: No ?Hospitalized since last visit: No ?Patient Has Alerts: No ?Implantable device outside of the clinic excluding No ?cellular tissue based products placed in the center ?since last visit: ?Has Dressing in Place as Prescribed: Yes ?Pain Present Now: No ?Electronic Signature(s) ?Signed: 01/23/2022 9:16:22 AM By: Carlene Coria RN ?Entered By: Carlene Coria on 01/17/2022 12:38:44 ?Prestwood, Wendolyn H. ([id number]) ?-------------------------------------------------------------------------------- ?Clinic Level of Care Assessment Details ?Patient Name: Mackenzie [name] ?Date of Service: 01/17/2022 12:30 PM ?Medical Record Number: [id number] ?Patient Account Number: [id number] ?Date of Birth/Sex: 1927-07-07 (88 y.o. F) ?Treating RN: Carlene Coria ?Primary Care Joselin Crandell: [facility] Other Clinician: ?Referring Alford Gamero: [facility] ?Treating Jassiel Flye/Extender: Ricard Dillon ?Weeks in Treatment: 3 ?Clinic Level of Care  Assessment Items ?TOOL 4 Quantity Score ?X - Use when only an EandM is performed on FOLLOW-UP visit 1 0 ?ASSESSMENTS - Nursing Assessment / Reassessment ?X - Reassessment of Co-morbidities (includes updates in patient status) 1 10 ?X- 1 5 ?Reassessment of Adherence to Treatment Plan ?ASSESSMENTS - Wound and Skin Assessment / Reassessment ?X - Simple Wound Assessment / Reassessment - one wound 1 5 ?'[]'$  - 0 ?Complex Wound Assessment / Reassessment - multiple wounds ?'[]'$  - 0 ?Dermatologic / Skin Assessment (not related to wound area) ?ASSESSMENTS - Focused Assessment ?'[]'$  - Circumferential Edema Measurements - multi extremities 0 ?'[]'$  - 0 ?Nutritional Assessment / Counseling / Intervention ?'[]'$  - 0 ?Lower Extremity Assessment (monofilament, tuning fork, pulses) ?'[]'$  - 0 ?Peripheral Arterial Disease Assessment (using hand held doppler) ?ASSESSMENTS - Ostomy and/or Continence Assessment and Care ?'[]'$  - Incontinence Assessment and Management 0 ?'[]'$  - 0 ?Ostomy Care Assessment and Management (repouching, etc.) ?PROCESS - Coordination of Care ?X - Simple Patient / Family Education for ongoing care 1 15 ?'[]'$  - 0 ?Complex (extensive) Patient / Family Education for ongoing care ?'[]'$  - 0 ?Staff obtains Consents, Records, Test Results / Process Orders ?'[]'$  - 0 ?Staff telephones HHA, Nursing Homes / Clarify orders / etc ?'[]'$  - 0 ?Routine Transfer to another Facility (non-emergent condition) ?'[]'$  - 0 ?Routine Hospital Admission (non-emergent condition) ?'[]'$  - 0 ?New Admissions / Biomedical engineer / Ordering NPWT, Apligraf, etc. ?'[]'$  - 0 ?Emergency Hospital Admission (emergent condition) ?'[]'$  - 0 ?Simple Discharge Coordination ?'[]'$  - 0 ?Complex (extensive) Discharge Coordination ?PROCESS - Special Needs ?'[]'$  - Pediatric / Minor Patient Management 0 ?'[]'$  - 0 ?Isolation Patient Management ?'[]'$  - 0 ?Hearing / Language / Visual special needs ?'[]'$  - 0 ?Assessment of Community assistance (transportation, D/C planning, etc.) ?'[]'$  - 0 ?Additional  assistance / Altered mentation ?'[]'$  - 0 ?Support Surface(s) Assessment (bed, cushion, seat, etc.) ?INTERVENTIONS - Wound Cleansing / Measurement ?Neuhaus, CIARRAH RAE ([id number]) ?X- 1 5 ?Simple Wound  Cleansing - one wound ?'[]'$  - 0 ?Complex Wound Cleansing - multiple wounds ?X- 1 5 ?Wound Imaging (photographs - any number of wounds) ?'[]'$  - 0 ?Wound Tracing (instead of photographs) ?X- 1 5 ?Simple Wound Measurement - one wound ?'[]'$  - 0 ?Complex Wound Measurement - multiple wounds ?INTERVENTIONS - Wound Dressings ?X - Small Wound Dressing one or multiple wounds 1 10 ?'[]'$  - 0 ?Medium Wound Dressing one or multiple wounds ?'[]'$  - 0 ?Large Wound Dressing one or multiple wounds ?'[]'$  - 0 ?Application of Medications - topical ?'[]'$  - 0 ?Application of Medications - injection ?INTERVENTIONS - Miscellaneous ?'[]'$  - External ear exam 0 ?'[]'$  - 0 ?Specimen Collection (cultures, biopsies, blood, body fluids, etc.) ?'[]'$  - 0 ?Specimen(s) / Culture(s) sent or taken to Lab for analysis ?'[]'$  - 0 ?Patient Transfer (multiple staff / Civil Service fast streamer / Similar devices) ?'[]'$  - 0 ?Simple Staple / Suture removal (25 or less) ?'[]'$  - 0 ?Complex Staple / Suture removal (26 or more) ?'[]'$  - 0 ?Hypo / Hyperglycemic Management (close monitor of Blood Glucose) ?'[]'$  - 0 ?Ankle / Brachial Index (ABI) - do not check if billed separately ?X- 1 5 ?Vital Signs ?Has the patient been seen at the hospital within the last three years: Yes ?Total Score: 65 ?Level Of Care: New/Established - Level ?2 ?Electronic Signature(s) ?Signed: 01/23/2022 9:16:22 AM By: Carlene Coria RN ?Entered By: Carlene Coria on 01/17/2022 12:50:27 ?Placeres, Adanna H. ([id number]) ?-------------------------------------------------------------------------------- ?Encounter Discharge Information Details ?Patient Name: Mackenzie [name] ?Date of Service: 01/17/2022 12:30 PM ?Medical Record Number: [id number] ?Patient Account Number: [id number] ?Date of Birth/Sex: [birth year] (88 y.o. F) ?Treating RN: Carlene Coria ?Primary  Care Shanara Schnieders: [facility] Other Clinician: ?Referring Gennaro Lizotte: [facility] ?Treating Fateh Kindle/Extender: Ricard Dillon ?Weeks in Treatment: 3 ?Encounter Discharge Information Items ?Discharge Condition: Stable ?Ambulatory Status: Ambulatory ?Discharge Destination: Home ?Transportation: Private Auto ?Accompanied By: daughter ?Schedule Follow-up Appointment: No ?Clinical Summary of Care: Patient Declined ?Electronic Signature(s) ?Signed: 01/23/2022 9:16:22 AM By: Carlene Coria RN ?Entered By: Carlene Coria on 01/17/2022 12:51:57 ?Brickel, Catharine H. ([id number]) ?-------------------------------------------------------------------------------- ?Lower Extremity Assessment Details ?Patient Name: MARLENI, GALLARDO ?Date of Service: 01/17/2022 12:30 PM ?Medical Record Number: [id number] ?Patient Account Number: [id number] ?Date of Birth/Sex: 02/15/1927 (88 y.o. F) ?Treating RN: Carlene Coria ?Primary Care Quanika Solem: [facility] Other Clinician: ?Referring Elizabella Nolet: [facility] ?Treating Na Waldrip/Extender: Ricard Dillon ?Weeks in Treatment: 3 ?Edema Assessment ?Assessed: [Left: No] [Right: No] ?Edema: [Left: Ye] [Right: s] ?Calf ?Left: Right: ?Point of Measurement: 35 cm From Medial Instep 30 cm ?Ankle ?Left: Right: ?Point of Measurement: 10 cm From Medial Instep 18 cm ?Vascular Assessment ?Pulses: ?Dorsalis Pedis ?Palpable: [Left:Yes] ?Electronic Signature(s) ?Signed: 01/23/2022 9:16:22 AM By: Carlene Coria RN ?Entered By: Carlene Coria on 01/17/2022 12:46:36 ?Carew, Isaac H. ([id number]) ?-------------------------------------------------------------------------------- ?Multi Wound Chart Details ?Patient Name: MAKENZIE, WEISNER ?Date of Service: 01/17/2022 12:30 PM ?Medical Record Number: [id number] ?Patient Account Number: [id number] ?Date of Birth/Sex: [birth year] (88 y.o. F) ?Treating RN: Carlene Coria ?Primary Care Matilyn Fehrman: [facility] Other Clinician: ?Referring Arma Reining: [facility] ?Treating Sherra Kimmons/Extender: Ricard Dillon ?Weeks in Treatment: 3 ?Vital Signs ?Height(in): 61 ?Pulse(bpm): 85 ?Weight(lbs): 136 ?Blood Pressure(mmHg): 156/78 ?Body Mass Index(BMI): 25.7 ?Temperature(??F): 97.9 ?Respiratory Rate(breaths/min): 16 ?Photos: [N/

## 2022-01-24 ENCOUNTER — Encounter (HOSPITAL_BASED_OUTPATIENT_CLINIC_OR_DEPARTMENT_OTHER): Payer: Medicare PPO | Admitting: Internal Medicine

## 2022-01-24 DIAGNOSIS — L97822 Non-pressure chronic ulcer of other part of left lower leg with fat layer exposed: Secondary | ICD-10-CM | POA: Diagnosis not present

## 2022-01-26 NOTE — Progress Notes (Signed)
Mackenzie [name], Mackenzie [name] ([id number]) ?Visit Report for 01/24/2022 ?Arrival Information Details ?Patient Name: Mackenzie [name], Mackenzie [name] ?Date of Service: 01/24/2022 12:30 PM ?Medical Record Number: [id number] ?Patient Account Number: [id number] ?Date of Birth/Sex: 21-Jul-1927 (88 y.o. F) ?Treating RN: Carlene Coria ?Primary Care Kiev Labrosse: [facility] Other Clinician: ?Referring Maxemiliano Riel: [facility] ?Treating Margaretmary Prisk/Extender: Kalman Shan ?Weeks in Treatment: 4 ?Visit Information History Since Last Visit ?All ordered tests and consults were completed: No ?Patient Arrived: Mackenzie [name] ?Added or deleted any medications: No ?Arrival Time: 12:36 ?Any new allergies or adverse reactions: No ?Accompanied By: daughter ?Had a fall or experienced change in No ?Transfer Assistance: None ?activities of daily living that may affect ?Patient Identification Verified: Yes ?risk of falls: ?Secondary Verification Process Completed: Yes ?Signs or symptoms of abuse/neglect since last visito No ?Patient Requires Transmission-Based Precautions: No ?Hospitalized since last visit: No ?Patient Has Alerts: No ?Implantable device outside of the clinic excluding No ?cellular tissue based products placed in the center ?since last visit: ?Has Dressing in Place as Prescribed: Yes ?Pain Present Now: No ?Electronic Signature(s) ?Signed: 01/26/2022 4:04:50 PM By: Carlene Coria RN ?Entered By: Carlene Coria on 01/24/2022 12:40:15 ?Saddler, Marigold H. ([id number]) ?-------------------------------------------------------------------------------- ?Clinic Level of Care Assessment Details ?Patient Name: Mackenzie [name], Mackenzie [name] ?Date of Service: 01/24/2022 12:30 PM ?Medical Record Number: [id number] ?Patient Account Number: [id number] ?Date of Birth/Sex: Apr 03, 1927 (88 y.o. F) ?Treating RN: Carlene Coria ?Primary Care Dodge Ator: [facility] Other Clinician: ?Referring Russel Morain: [facility] ?Treating Lachlan Pelto/Extender: Kalman Shan ?Weeks in Treatment: 4 ?Clinic Level of Care Assessment  Items ?TOOL 1 Quantity Score ?'[]'$  - Use when EandM and Procedure is performed on INITIAL visit 0 ?ASSESSMENTS - Nursing Assessment / Reassessment ?'[]'$  - General Physical Exam (combine w/ comprehensive assessment (listed just below) when performed on new ?0 ?pt. evals) ?'[]'$  - 0 ?Comprehensive Assessment (HX, ROS, Risk Assessments, Wounds Hx, etc.) ?ASSESSMENTS - Wound and Skin Assessment / Reassessment ?'[]'$  - Dermatologic / Skin Assessment (not related to wound area) 0 ?ASSESSMENTS - Ostomy and/or Continence Assessment and Care ?'[]'$  - Incontinence Assessment and Management 0 ?'[]'$  - 0 ?Ostomy Care Assessment and Management (repouching, etc.) ?PROCESS - Coordination of Care ?'[]'$  - Simple Patient / Family Education for ongoing care 0 ?'[]'$  - 0 ?Complex (extensive) Patient / Family Education for ongoing care ?'[]'$  - 0 ?Staff obtains Consents, Records, Test Results / Process Orders ?'[]'$  - 0 ?Staff telephones HHA, Nursing Homes / Clarify orders / etc ?'[]'$  - 0 ?Routine Transfer to another Facility (non-emergent condition) ?'[]'$  - 0 ?Routine Hospital Admission (non-emergent condition) ?'[]'$  - 0 ?New Admissions / Biomedical engineer / Ordering NPWT, Apligraf, etc. ?'[]'$  - 0 ?Emergency Hospital Admission (emergent condition) ?PROCESS - Special Needs ?'[]'$  - Pediatric / Minor Patient Management 0 ?'[]'$  - 0 ?Isolation Patient Management ?'[]'$  - 0 ?Hearing / Language / Visual special needs ?'[]'$  - 0 ?Assessment of Community assistance (transportation, D/C planning, etc.) ?'[]'$  - 0 ?Additional assistance / Altered mentation ?'[]'$  - 0 ?Support Surface(s) Assessment (bed, cushion, seat, etc.) ?INTERVENTIONS - Miscellaneous ?'[]'$  - External ear exam 0 ?'[]'$  - 0 ?Patient Transfer (multiple staff / Civil Service fast streamer / Similar devices) ?'[]'$  - 0 ?Simple Staple / Suture removal (25 or less) ?'[]'$  - 0 ?Complex Staple / Suture removal (26 or more) ?'[]'$  - 0 ?Hypo/Hyperglycemic Management (do not check if billed separately) ?'[]'$  - 0 ?Ankle / Brachial Index (ABI) - do not check if  billed separately ?Has the patient been seen at the hospital within the last three years: Yes ?Total Score: 0 ?Level  Of Care: ____ ?Mackenzie [name], Mackenzie [name] ([id number]) ?Electronic Signature(s) ?Signed: 01/26/2022 4:04:50 PM By: Carlene Coria RN ?Entered By: Carlene Coria on 01/24/2022 13:21:09 ?Mackenzie [name], Mackenzie H. ([id number]) ?-------------------------------------------------------------------------------- ?Encounter Discharge Information Details ?Patient Name: Mackenzie [name], Mackenzie [name] ?Date of Service: 01/24/2022 12:30 PM ?Medical Record Number: [id number] ?Patient Account Number: [id number] ?Date of Birth/Sex: [birth year] (88 y.o. F) ?Treating RN: Carlene Coria ?Primary Care Cierra Rothgeb: [facility] Other Clinician: ?Referring Amandajo Gonder: [facility] ?Treating Lenzy Kerschner/Extender: Kalman Shan ?Weeks in Treatment: 4 ?Encounter Discharge Information Items Post Procedure Vitals ?Discharge Condition: Stable ?Temperature (?F): 97.8 ?Ambulatory Status: Mackenzie [name] ?Pulse (bpm): 79 ?Discharge Destination: Home ?Respiratory Rate (breaths/min): 16 ?Transportation: Private Auto ?Blood Pressure (mmHg): 163/80 ?Accompanied By: daughter ?Schedule Follow-up Appointment: Yes ?Clinical Summary of Care: Patient Declined ?Electronic Signature(s) ?Signed: 01/26/2022 4:04:50 PM By: Carlene Coria RN ?Entered By: Carlene Coria on 01/24/2022 13:22:12 ?Mackenzie [name], Mackenzie H. ([id number]) ?-------------------------------------------------------------------------------- ?Lower Extremity Assessment Details ?Patient Name: Mackenzie [name], Mackenzie [name] ?Date of Service: 01/24/2022 12:30 PM ?Medical Record Number: [id number] ?Patient Account Number: [id number] ?Date of Birth/Sex: [birth year] (88 y.o. F) ?Treating RN: Carlene Coria ?Primary Care Amorina Doerr: [facility] Other Clinician: ?Referring Tenesia Escudero: [facility] ?Treating Kenedi Cilia/Extender: Kalman Shan ?Weeks in Treatment: 4 ?Edema Assessment ?Assessed: [Left: No] [Right: No] ?Edema: [Left: N] [Right: o] ?Calf ?Left: Right: ?Point of  Measurement: 35 cm From Medial Instep 30 cm ?Ankle ?Left: Right: ?Point of Measurement: 10 cm From Medial Instep 18 cm ?Vascular Assessment ?Pulses: ?Dorsalis Pedis ?Palpable: [Left:Yes] ?Electronic Signature(s) ?Signed: 01/26/2022 4:04:50 PM By: Carlene Coria RN ?Entered By: Carlene Coria on 01/24/2022 12:48:46 ?Mackenzie [name], Mackenzie H. ([id number]) ?-------------------------------------------------------------------------------- ?Multi Wound Chart Details ?Patient Name: Mackenzie [name], Mackenzie [name] ?Date of Service: 01/24/2022 12:30 PM ?Medical Record Number: [id number] ?Patient Account Number: [id number] ?Date of Birth/Sex: 09/30/1927 (88 y.o. F) ?Treating RN: Carlene Coria ?Primary Care Karys Meckley: [facility] Other Clinician: ?Referring Kaison Mcparland: [facility] ?Treating Jonny Dearden/Extender: Kalman Shan ?Weeks in Treatment: 4 ?Vital Signs ?Height(in): 61 ?Pulse(bpm): 79 ?Weight(lbs): 136 ?Blood Pressure(mmHg): 163/80 ?Body Mass Index(BMI): 25.7 ?Temperature(??F): 97.8 ?Respiratory Rate(breaths/min): 18 ?Photos: [N/A:N/A] ?Wound Location: Left, Anterior Lower Leg N/A N/A ?Wounding Event: Trauma N/A N/A ?Primary Etiology: Trauma, Other N/A N/A ?Comorbid History: Hypertension N/A N/A ?Date Acquired: 11/23/2021 N/A N/A ?Weeks of Treatment: 4 N/A N/A ?Wound Status: Open N/A N/A ?Wound Recurrence: No N/A N/A ?Clustered Wound: Yes N/A N/A ?Measurements L x W x D (cm) 1.7x0.7x0.1 N/A N/A ?Area (cm?) : 0.935 N/A N/A ?Volume (cm?) : 0.093 N/A N/A ?% Reduction in Area: 98.60% N/A N/A ?% Reduction in Volume: 98.70% N/A N/A ?Classification: Full Thickness Without Exposed N/A N/A ?Support Structures ?Exudate Amount: Medium N/A N/A ?Exudate Type: Serosanguineous N/A N/A ?Exudate Color: red, brown N/A N/A ?Granulation Amount: Large (67-100%) N/A N/A ?Granulation Quality: Red, Pink N/A N/A ?Necrotic Amount: None Present (0%) N/A N/A ?Exposed Structures: ?Fat Layer (Subcutaneous Tissue): N/A N/A ?Yes ?Fascia: No ?Tendon: No ?Muscle: No ?Joint: No ?Bone:  No ?Epithelialization: None N/A N/A ?Treatment Notes ?Electronic Signature(s) ?Signed: 01/26/2022 4:04:50 PM By: Carlene Coria RN ?Entered By: Carlene Coria on 01/24/2022 12:49:05 ?Mcfadyen, Alonie H. ([id number]) ?--------------

## 2022-01-26 NOTE — Progress Notes (Signed)
Nesby, ANUJA MANKA ([id number]) ?Visit Report for 01/24/2022 ?Chief Complaint Document Details ?Patient Name: Mackenzie [name], Mackenzie [name] ?Date of Service: 01/24/2022 12:30 PM ?Medical Record Number: [id number] ?Patient Account Number: [id number] ?Date of Birth/Sex: 1927-08-09 (88 y.o. F) ?Treating RN: Carlene Coria ?Primary Care Provider: Ramonita Lab Other Clinician: ?Referring Provider: Ramonita Lab ?Treating Provider/Extender: Kalman Shan ?Weeks in Treatment: 4 ?Information Obtained from: Patient ?Chief Complaint ?Left lower extremity wound following trauma ?Electronic Signature(s) ?Signed: 01/24/2022 2:09:12 PM By: Kalman Shan DO ?Entered By: Kalman Shan on 01/24/2022 14:04:48 ?Lefeber, Delight H. ([id number]) ?-------------------------------------------------------------------------------- ?Debridement Details ?Patient Name: Mackenzie [name] ?Date of Service: 01/24/2022 12:30 PM ?Medical Record Number: [id number] ?Patient Account Number: [id number] ?Date of Birth/Sex: September 22, 1927 (88 y.o. F) ?Treating RN: Carlene Coria ?Primary Care Provider: Ramonita Lab Other Clinician: ?Referring Provider: Ramonita Lab ?Treating Provider/Extender: Kalman Shan ?Weeks in Treatment: 4 ?Debridement Performed for ?Wound #1 Left,Anterior Lower Leg ?Assessment: ?Performed By: Physician Kalman Shan, MD ?Debridement Type: Debridement ?Level of Consciousness (Pre- ?Awake and Alert ?procedure): ?Pre-procedure Verification/Time Out ?Yes - 13:18 ?Taken: ?Start Time: 13:18 ?Total Area Debrided (L x W): 1.7 (cm) x 0.7 (cm) = 1.19 (cm?) ?Tissue and other material ?Viable, Non-Viable, Slough, Subcutaneous, Skin: Dermis , Skin: Epidermis, Slough ?debrided: ?Level: Skin/Subcutaneous Tissue ?Debridement Description: Excisional ?Instrument: Curette ?Bleeding: Moderate ?Hemostasis Achieved: Pressure ?End Time: 13:22 ?Procedural Pain: 0 ?Post Procedural Pain: 0 ?Response to Treatment: Procedure was tolerated well ?Level of Consciousness (Post- ?Awake  and Alert ?procedure): ?Post Debridement Measurements of Total Wound ?Length: (cm) 1.7 ?Width: (cm) 0.7 ?Depth: (cm) 0.1 ?Volume: (cm?) 0.093 ?Character of Wound/Ulcer Post Debridement: Improved ?Post Procedure Diagnosis ?Same as Pre-procedure ?Electronic Signature(s) ?Signed: 01/24/2022 2:09:12 PM By: Kalman Shan DO ?Signed: 01/26/2022 4:04:50 PM By: Carlene Coria RN ?Entered By: Carlene Coria on 01/24/2022 13:20:38 ?Licciardi, Annie H. ([id number]) ?-------------------------------------------------------------------------------- ?HPI Details ?Patient Name: Mackenzie [name] ?Date of Service: 01/24/2022 12:30 PM ?Medical Record Number: [id number] ?Patient Account Number: [id number] ?Date of Birth/Sex: August 10, 1927 (88 y.o. F) ?Treating RN: Carlene Coria ?Primary Care Provider: Ramonita Lab Other Clinician: ?Referring Provider: Ramonita Lab ?Treating Provider/Extender: Kalman Shan ?Weeks in Treatment: 4 ?History of Present Illness ?HPI Description: Admission 12/27/2021 ?Ms. Shakthi Scipio is a 88 year old female with a past medical history of A-fib on Eliquis, CVA and hypothyroidism that presents to the clinic for a ?6-week history of wound to the left lower extremity. 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 She visited the ED for this issue. She had a hematoma and this was evacuated. She also visited her primary ?care physician for wound checks. She has been using Vaseline with dressing changes. On 3/10 she was started on Keflex for wound infection. ?Over the past 6 weeks patient reports improvement in wound healing. She currently denies signs of infection. ?01/03/2022; patient presents for follow-up. She has no issues or complaints today. She has been using Hydrofera Blue to the wound beds. She ?denies signs of infection. ?4/5; patient presents for follow-up. She has been using Hydrofera Blue to the wound bed without issues. She is  been using antibiotic ointment to ?the scattered wound beds with improvement in healing. She has no issues or complaints today. She denies signs of infection. ?4/12; patient using Hydrofera Blue kerlix. Wound is much better smaller healthy granulation. ?4/19; patient presents for follow-up. She continues to use [facility] without issues. She reports improvement in wound healing. ?Electronic Signature(s) ?Signed: 01/24/2022 2:09:12 PM  By: Kalman Shan DO ?Entered By: Kalman Shan on 01/24/2022 14:05:10 ?Samuelson, Kateline H. ([id number]) ?-------------------------------------------------------------------------------- ?Physical Exam Details ?Patient Name: [name], [name] ?Date of Service: 01/24/2022 12:30 PM ?Medical Record Number: [id number] ?Patient Account Number: [id number] ?Date of Birth/Sex: 02/04/1927 (88 y.o. F) ?Treating RN: Carlene Coria ?Primary Care Provider: Ramonita Lab Other Clinician: ?Referring Provider: Ramonita Lab ?Treating Provider/Extender: Kalman Shan ?Weeks in Treatment: 4 ?Constitutional ?. ?Cardiovascular ?Marland Kitchen ?Psychiatric ?Marland Kitchen ?Notes ?Left lower extremity: Open wound to the anterior aspect with granulation tissue and scant nonviable tissue. No signs of surrounding infection. ?Electronic Signature(s) ?Signed: 01/24/2022 2:09:12 PM By: Kalman Shan DO ?Entered By: Kalman Shan on 01/24/2022 14:05:53 ?Weatherholtz, Mikia H. ([id number]) ?-------------------------------------------------------------------------------- ?Physician Orders Details ?Patient Name: Mackenzie [name] ?Date of Service: 01/24/2022 12:30 PM ?Medical Record Number: [id number] ?Patient Account Number: [id number] ?Date of Birth/Sex: November 22, 1926 (88 y.o. F) ?Treating RN: Carlene Coria ?Primary Care Provider: Ramonita Lab Other Clinician: ?Referring Provider: Ramonita Lab ?Treating Provider/Extender: Kalman Shan ?Weeks in Treatment: 4 ?Verbal / Phone Orders: No ?Diagnosis Coding ?Follow-up Appointments ?o Return Appointment  in 1 week. ?Bathing/ Shower/ Hygiene ?o May shower; gently cleanse wound with antibacterial soap, rinse and pat dry prior to dressing wounds ?Edema Control - Lymphedema / Segmental Compressive Device / Other ?o Elevate, Exercise Daily and Avoid Standing for Long Periods of Time. ?o Elevate legs to the level of the heart and pump ankles as often as possible ?o Elevate leg(s) parallel to the floor when sitting. ?Wound Treatment ?Wound #1 - Lower Leg Wound Laterality: Left, Anterior ?Cleanser: Byram Ancillary Kit - 15 Day Supply (Generic) Every Other Day/30 Days ?Discharge Instructions: Use supplies as instructed; Kit contains: (15) Saline Bullets; (15) 3x3 Gauze; 15 pr Gloves ?Cleanser: Soap and Water Every Other Day/30 Days ?Discharge Instructions: Gently cleanse wound with antibacterial soap, rinse and pat dry prior to dressing wounds ?Topical: Triple Antibiotic Ointment, 1 (oz) Tube Every Other Day/30 Days ?Discharge Instructions: apply to smaller openings ?Primary Dressing: Hydrofera Blue Ready Transfer Foam, 2.5x2.5 (in/in) (Generic) Every Other Day/30 Days ?Discharge Instructions: Apply Hydrofera Blue Ready to wound bed as directed. Apply only to large wound ?Secondary Dressing: Gauze Every Other Day/30 Days ?Discharge Instructions: As directed: dry ?Secondary Dressing: Kerlix 4.5 x 4.1 (in/yd) (Generic) Every Other Day/30 Days ?Discharge Instructions: Apply Kerlix 4.5 x 4.1 (in/yd) as instructed ?Secured With: Medipore Tape - 44M Medipore H Soft Cloth Surgical Tape, 2x2 (in/yd) (Generic) Every Other Day/30 Days ?Electronic Signature(s) ?Signed: 01/24/2022 2:09:12 PM By: Kalman Shan DO ?Previous Signature: 01/24/2022 12:58:01 PM Version By: Carlene Coria RN ?Entered By: Kalman Shan on 01/24/2022 14:08:38 ?Lacross, Raechel H. ([id number]) ?-------------------------------------------------------------------------------- ?Problem List Details ?Patient Name: JULE, WHITSEL ?Date of Service: 01/24/2022  12:30 PM ?Medical Record Number: [id number] ?Patient Account Number: [id number] ?Date of Birth/Sex: [birth year] (88 y.o. F) ?Treating RN: Carlene Coria ?Primary Care Provider: Ramonita Lab Other Clinician: ?

## 2022-01-31 ENCOUNTER — Encounter (HOSPITAL_BASED_OUTPATIENT_CLINIC_OR_DEPARTMENT_OTHER): Payer: Medicare PPO | Admitting: Internal Medicine

## 2022-01-31 DIAGNOSIS — Z7901 Long term (current) use of anticoagulants: Secondary | ICD-10-CM

## 2022-01-31 DIAGNOSIS — L97822 Non-pressure chronic ulcer of other part of left lower leg with fat layer exposed: Secondary | ICD-10-CM

## 2022-01-31 DIAGNOSIS — I4891 Unspecified atrial fibrillation: Secondary | ICD-10-CM

## 2022-01-31 DIAGNOSIS — T798XXA Other early complications of trauma, initial encounter: Secondary | ICD-10-CM

## 2022-02-02 NOTE — Progress Notes (Signed)
Mackenzie [name] ([id number]) ?Visit Report for 01/31/2022 ?Chief Complaint Document Details ?Patient Name: Mackenzie [name], Mackenzie [name] ?Date of Service: 01/31/2022 12:30 PM ?Medical Record Number: [id number] ?Patient Account Number: [id number] ?Date of Birth/Sex: 1926-10-18 (88 y.o. F) ?Treating RN: Carlene Coria ?Primary Care Provider: Ramonita Lab Other Clinician: ?Referring Provider: Ramonita Lab ?Treating Provider/Extender: Kalman Shan ?Weeks in Treatment: 5 ?Information Obtained from: Patient ?Chief Complaint ?Left lower extremity wound following trauma ?Electronic Signature(s) ?Signed: 01/31/2022 2:08:03 PM By: Kalman Shan DO ?Entered By: Kalman Shan on 01/31/2022 13:23:41 ?Nearhood, Telina H. ([id number]) ?-------------------------------------------------------------------------------- ?HPI Details ?Patient Name: Mackenzie [name] ?Date of Service: 01/31/2022 12:30 PM ?Medical Record Number: [id number] ?Patient Account Number: [id number] ?Date of Birth/Sex: August 16, 1927 (88 y.o. F) ?Treating RN: Carlene Coria ?Primary Care Provider: Ramonita Lab Other Clinician: ?Referring Provider: Ramonita Lab ?Treating Provider/Extender: Kalman Shan ?Weeks in Treatment: 5 ?History of Present Illness ?HPI Description: Admission 12/27/2021 ?Ms. Gweneth Fredlund is a 88 year old female with a past medical history of A-fib on Eliquis, CVA and hypothyroidism that presents to the clinic for a ?6-week history of wound to the left lower extremity. 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 She visited the ED for this issue. She had a hematoma and this was evacuated. She also visited her primary ?care physician for wound checks. She has been using Vaseline with dressing changes. On 3/10 she was started on Keflex for wound infection. ?Over the past 6 weeks patient reports improvement in wound healing. She currently denies signs of infection. ?01/03/2022;  patient presents for follow-up. She has no issues or complaints today. She has been using Hydrofera Blue to the wound beds. She ?denies signs of infection. ?4/5; patient presents for follow-up. She has been using Hydrofera Blue to the wound bed without issues. She is been using antibiotic ointment to ?the scattered wound beds with improvement in healing. She has no issues or complaints today. She denies signs of infection. ?4/12; patient using Hydrofera Blue kerlix. Wound is much better smaller healthy granulation. ?4/19; patient presents for follow-up. She continues to use [facility] without issues. She reports improvement in wound healing. ?4/26; patient presents for follow-up. She has been using Hydrofera Blue without issues. The wound site has scabbed over. This was easily ?removed by the intake nurse when cleaning. She has no issues or complaints today. She denies signs of infection. ?Electronic Signature(s) ?Signed: 01/31/2022 2:08:03 PM By: Kalman Shan DO ?Entered By: Kalman Shan on 01/31/2022 13:24:21 ?Cater, Takiyah H. ([id number]) ?-------------------------------------------------------------------------------- ?Physical Exam Details ?Patient Name: Mackenzie [name] ?Date of Service: 01/31/2022 12:30 PM ?Medical Record Number: [id number] ?Patient Account Number: [id number] ?Date of Birth/Sex: 08/31/1927 (88 y.o. F) ?Treating RN: Carlene Coria ?Primary Care Provider: Ramonita Lab Other Clinician: ?Referring Provider: Ramonita Lab ?Treating Provider/Extender: Kalman Shan ?Weeks in Treatment: 5 ?Constitutional ?. ?Cardiovascular ?Marland Kitchen ?Psychiatric ?Marland Kitchen ?Notes ?Left lower extremity: Open wound to the anterior aspect with granulation tissue throughout. No signs of surrounding infection. ?Electronic Signature(s) ?Signed: 01/31/2022 2:08:03 PM By: Kalman Shan DO ?Entered By: Kalman Shan on 01/31/2022 13:24:40 ?Cappucci, Sheana H.  ([id number]) ?-------------------------------------------------------------------------------- ?Physician Orders Details ?Patient Name: Mackenzie [name] ?Date of Service: 01/31/2022 12:30 PM ?Medical Record Number: [id number] ?Patient Account Number: [id number] ?Date of Birth/Sex: 01-12-1927 (88 y.o. F) ?Treating RN: Carlene Coria ?Primary Care Provider: Ramonita Lab Other Clinician: ?Referring Provider: Ramonita Lab ?Treating Provider/Extender: Kalman Shan ?Weeks in Treatment: 5 ?Verbal / Phone Orders: No ?Diagnosis  Coding ?Follow-up Appointments ?o Return Appointment in 2 weeks. ?Bathing/ Shower/ Hygiene ?o May shower; gently cleanse wound with antibacterial soap, rinse and pat dry prior to dressing wounds ?Edema Control - Lymphedema / Segmental Compressive Device / Other ?o Elevate, Exercise Daily and Avoid Standing for Long Periods of Time. ?o Elevate legs to the level of the heart and pump ankles as often as possible ?o Elevate leg(s) parallel to the floor when sitting. ?Wound Treatment ?Wound #1 - Lower Leg Wound Laterality: Left, Anterior ?Cleanser: Byram Ancillary Kit - 15 Day Supply (Generic) Every Other Day/30 Days ?Discharge Instructions: Use supplies as instructed; Kit contains: (15) Saline Bullets; (15) 3x3 Gauze; 15 pr Gloves ?Cleanser: Soap and Water Every Other Day/30 Days ?Discharge Instructions: Gently cleanse wound with antibacterial soap, rinse and pat dry prior to dressing wounds ?Primary Dressing: Xeroform 5x9-HBD (in/in) Every Other Day/30 Days ?Discharge Instructions: Apply Xeroform 5x9-HBD (in/in) as directed ?Secondary Dressing: Gauze Every Other Day/30 Days ?Discharge Instructions: As directed: dry ?Secondary Dressing: Kerlix 4.5 x 4.1 (in/yd) (Generic) Every Other Day/30 Days ?Discharge Instructions: Apply Kerlix 4.5 x 4.1 (in/yd) as instructed ?Secured With: Medipore Tape - 59M Medipore H Soft Cloth Surgical Tape, 2x2 (in/yd) (Generic) Every Other Day/30 Days ?Electronic  Signature(s) ?Signed: 01/31/2022 2:08:03 PM By: Kalman Shan DO ?Entered By: Kalman Shan on 01/31/2022 13:26:52 ?Panebianco, Airiana H. ([id number]) ?-------------------------------------------------------------------------------- ?Problem List Details ?Patient Name: GABBIE, MARZO ?Date of Service: 01/31/2022 12:30 PM ?Medical Record Number: [id number] ?Patient Account Number: [id number] ?Date of Birth/Sex: November 11, 1926 (88 y.o. 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 ([id number]) ?-------------------------------------------------------------------------------- ?Progress Note Details ?Patient Name: SHAMONA, WIRTZ ?Date of Service: 01/31/2022 12:30 PM ?Medical Record Number: [id number] ?Patient Account Number: [id number] ?Date of Birth/Sex: March 22, 1927 (88 y.o. F) ?Treating RN: Carlene Coria ?Primary Care Provider: Ramonita Lab Other Clinician: ?Referring Provider: Ramonita Lab ?Treating Provider/Extender: Kalman Shan ?Weeks in Treatment: 5 ?Subjective ?Chief Complaint ?Information obtained from Patient ?Left lower extremity wound following trauma ?History of Present Illness (HPI) ?Admission 12/27/2021 ?Ms. Kristyn Obyrne is a 88 year old female with a past medical history of A-fib on Eliquis, CVA and  hypothyroidism that presents to the clinic for a ?6-week history of wound to the left lower extremity. 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 She visited the ED for this issue. She had a hemato

## 2022-02-02 NOTE — Progress Notes (Addendum)
Mackenzie, [name] ([id number]) Visit Report for 01/31/2022 Arrival Information Details Patient Name: Mackenzie [name], Mackenzie [name]. Date of Service: 01/31/2022 12:30 PM Medical Record Number: [id number] Patient Account Number: [id number] Date of Birth/Sex: [birth year] (88 y.o. F) Treating RN: Carlene Coria Primary Care Ibn Stief: Ramonita Lab Other Clinician: Referring Calirose Mccance: Ramonita Lab Treating Brynleigh Sequeira/Extender: Yaakov Guthrie in Treatment: 5 Visit Information History Since Last Visit All ordered tests and consults were completed: No Patient Arrived: Kasandra Knudsen Added or deleted any medications: No Arrival Time: 12:37 Any new allergies or adverse reactions: No Accompanied By: self Had a fall or experienced change in No Transfer Assistance: None activities of daily living that may affect Patient Identification Verified: Yes risk of falls: Secondary Verification Process Completed: Yes Signs or symptoms of abuse/neglect since last visito No Patient Requires Transmission-Based Precautions: No Hospitalized since last visit: No Patient Has Alerts: No Implantable device outside of the clinic excluding No cellular tissue based products placed in the center since last visit: Has Dressing in Place as Prescribed: Yes Pain Present Now: No Electronic Signature(s) Signed: 02/02/2022 1:52:28 PM By: Carlene Coria RN Entered By: Carlene Coria on 01/31/2022 12:41:09 Roher, Wynonia Musty ([id number]) -------------------------------------------------------------------------------- Clinic Level of Care Assessment Details Patient Name: Mackenzie, [name]. Date of Service: 01/31/2022 12:30 PM Medical Record Number: [id number] Patient Account Number: [id number] Date of Birth/Sex: 1927-07-15 (88 y.o. F) Treating RN: Carlene Coria Primary Care Seamus Warehime: Ramonita Lab Other Clinician: Referring Ahnyla Mendel: Ramonita Lab Treating Alleene Stoy/Extender: Yaakov Guthrie in Treatment: 5 Clinic Level of Care Assessment  Items TOOL 4 Quantity Score X - Use when only an EandM is performed on FOLLOW-UP visit 1 0 ASSESSMENTS - Nursing Assessment / Reassessment X - Reassessment of Co-morbidities (includes updates in patient status) 1 10 X- 1 5 Reassessment of Adherence to Treatment Plan ASSESSMENTS - Wound and Skin Assessment / Reassessment X - Simple Wound Assessment / Reassessment - one wound 1 5 _0  - 0 Complex Wound Assessment / Reassessment - multiple wounds _1  - 0 Dermatologic / Skin Assessment (not related to wound area) ASSESSMENTS - Focused Assessment _2  - Circumferential Edema Measurements - multi extremities 0 _3  - 0 Nutritional Assessment / Counseling / Intervention _4  - 0 Lower Extremity Assessment (monofilament, tuning fork, pulses) _5  - 0 Peripheral Arterial Disease Assessment (using hand held doppler) ASSESSMENTS - Ostomy and/or Continence Assessment and Care _6  - Incontinence Assessment and Management 0 _7  - 0 Ostomy Care Assessment and Management (repouching, etc.) PROCESS - Coordination of Care X - Simple Patient / Family Education for ongoing care 1 15 _8  - 0 Complex (extensive) Patient / Family Education for ongoing care _9  - 0 Staff obtains Programmer, systems, Records, Test Results / Process Orders _10  - 0 Staff telephones HHA, Nursing Homes / Clarify orders / etc _11  - 0 Routine Transfer to another Facility (non-emergent condition) _12  - 0 Routine Hospital Admission (non-emergent condition) _13  - 0 New Admissions / Biomedical engineer / Ordering NPWT, Apligraf, etc. _14  - 0 Emergency Hospital Admission (emergent condition) X- 1 10 Simple Discharge Coordination _15  - 0 Complex (extensive) Discharge Coordination PROCESS - Special Needs _16  - Pediatric / Minor Patient Management 0 _17  - 0 Isolation Patient Management _18  - 0 Hearing / Language / Visual special needs _19  - 0 Assessment of Community assistance (transportation, D/C planning, etc.) _20  - 0 Additional assistance /  Altered mentation _21  - 0 Support Surface(s) Assessment (bed, cushion, seat, etc.) INTERVENTIONS - Wound Cleansing / Measurement Taddeo, Adalia H. ([id number]) X- 1 5 Simple Wound Cleansing -  one wound _0  - 0 Complex Wound Cleansing - multiple wounds X- 1 5 Wound Imaging (photographs - any number of wounds) _1  - 0 Wound Tracing (instead of photographs) X- 1 5 Simple Wound Measurement - one wound _2  - 0 Complex Wound Measurement - multiple wounds INTERVENTIONS - Wound Dressings X - Small Wound Dressing one or multiple wounds 1 10 _3  - 0 Medium Wound Dressing one or multiple wounds _4  - 0 Large Wound Dressing one or multiple wounds <YCXKGYJEHUDJSHFW>_2<\/OVZCHYIFOYDXAJOI>_7  - 0 Application of Medications - topical <OMVEHMCNOBSJGGEZ>_6<\/OQHUTMLYYTKPTWSF>_6  - 0 Application of Medications - injection INTERVENTIONS - Miscellaneous _7  - External ear exam 0 _8  - 0 Specimen Collection (cultures, biopsies, blood, body fluids, etc.) _9  - 0 Specimen(s) / Culture(s) sent or taken to Lab for analysis _10  - 0 Patient Transfer (multiple staff / Civil Service fast streamer / Similar devices) _11  - 0 Simple Staple / Suture removal (25 or less) _12  - 0 Complex Staple / Suture removal (26 or more) _13  - 0 Hypo / Hyperglycemic Management (close monitor of Blood Glucose) _14  - 0 Ankle / Brachial Index (ABI) - do not check if billed separately X- 1 5 Vital Signs Has the patient been seen at the hospital within the last three years: Yes Total Score: 75 Level Of Care: New/Established - Level 2 Electronic Signature(s) Signed: 02/02/2022 1:52:28 PM By: Carlene Coria RN Entered By: Carlene Coria on 01/31/2022 13:02:57 Swearingin, Wynonia Musty ([id number]) -------------------------------------------------------------------------------- Encounter Discharge Information Details Patient Name: Jenell Milliner H. Date of Service: 01/31/2022 12:30 PM Medical Record Number: [id number] Patient Account Number: [id number] Date of Birth/Sex: April 27, 1927 (88 y.o. F) Treating RN: Carlene Coria Primary Care Tristram Milian:  Ramonita Lab Other Clinician: Referring Akeyla Molden: Ramonita Lab Treating Kiet Geer/Extender: Yaakov Guthrie in Treatment: 5 Encounter Discharge Information Items Discharge Condition: Stable Ambulatory Status: Cane Discharge Destination: Home Transportation: Private Auto Accompanied By: self Schedule Follow-up Appointment: Yes Clinical Summary of Care: Patient Declined Electronic Signature(s) Signed: 02/02/2022 1:52:28 PM By: Carlene Coria RN Entered By: Carlene Coria on 01/31/2022 13:04:29 Navratil, Wynonia Musty ([id number]) -------------------------------------------------------------------------------- Lower Extremity Assessment Details Patient Name: Keeran, Kymiah H. Date of Service: 01/31/2022 12:30 PM Medical Record Number: [id number] Patient Account Number: [id number] Date of Birth/Sex: 10-30-1926 (88 y.o. F) Treating RN: Carlene Coria Primary Care Burdell Peed: Ramonita Lab Other Clinician: Referring Avarey Yaeger: Ramonita Lab Treating Keltin Baird/Extender: Yaakov Guthrie in Treatment: 5 Edema Assessment Assessed: [Left: No] [Right: No] Edema: [Left: Ye] [Right: s] Calf Left: Right: Point of Measurement: 35 cm From Medial Instep 31 cm Ankle Left: Right: Point of Measurement: 10 cm From Medial Instep 18 cm Vascular Assessment Pulses: Dorsalis Pedis Palpable: [Left:Yes] Electronic Signature(s) Signed: 02/02/2022 1:52:28 PM By: Carlene Coria RN Entered By: Carlene Coria on 01/31/2022 12:47:13 Congrove, Wynonia Musty ([id number]) -------------------------------------------------------------------------------- Multi Wound Chart Details Patient Name: Mcfarren, Emelly H. Date of Service: 01/31/2022 12:30 PM Medical Record Number: [id number] Patient Account Number: [id number] Date of Birth/Sex: 1927/06/17 (88 y.o. F) Treating RN: Carlene Coria Primary Care Shawnette Augello: Ramonita Lab Other Clinician: Referring Caileen Veracruz: Ramonita Lab Treating Zaydon Kinser/Extender: Yaakov Guthrie in Treatment:  5 Vital Signs Height(in): 61 Pulse(bpm): 20 Weight(lbs): 136 Blood Pressure(mmHg): 170/83 Body Mass Index(BMI): 25.7 Temperature(F): 97.8 Respiratory Rate(breaths/min): 18 Photos: [N/A:N/A] Wound Location: Left, Anterior Lower Leg N/A N/A Wounding Event: Trauma N/A N/A Primary Etiology: Trauma, Other N/A N/A Comorbid History: Hypertension N/A N/A Date Acquired: 11/23/2021 N/A N/A Weeks of Treatment: 5 N/A N/A Wound Status: Open N/A N/A Wound Recurrence: No N/A N/A Clustered Wound: Yes N/A N/A Measurements L x W  x D (cm) 1.7x0.4x0.1 N/A N/A Area (cm) : 0.534 N/A N/A Volume (cm) : 0.053 N/A N/A % Reduction in Area: 99.20% N/A N/A % Reduction in Volume: 99.20% N/A N/A Classification: Full Thickness Without Exposed N/A N/A Support Structures Exudate Amount: Medium N/A N/A Exudate Type: Serosanguineous N/A N/A Exudate Color: red, brown N/A N/A Granulation Amount: Large (67-100%) N/A N/A Granulation Quality: Red, Pink N/A N/A Necrotic Amount: None Present (0%) N/A N/A Exposed Structures: Fat Layer (Subcutaneous Tissue): N/A N/A Yes Fascia: No Tendon: No Muscle: No Joint: No Bone: No Epithelialization: None N/A N/A Treatment Notes Electronic Signature(s) Signed: 02/02/2022 1:52:28 PM By: Carlene Coria RN Entered By: Carlene Coria on 01/31/2022 12:47:35 Gonet, Wynonia Musty ([id number]) -------------------------------------------------------------------------------- [place] Details Patient Name: Norva Pavlov. Date of Service: 01/31/2022 12:30 PM Medical Record Number: [id number] Patient Account Number: [id number] Date of Birth/Sex: March 17, 1927 (88 y.o. F) Treating RN: Carlene Coria Primary Care Shavonda Wiedman: Ramonita Lab Other Clinician: Referring Manas Hickling: Ramonita Lab Treating Dula Havlik/Extender: Yaakov Guthrie in Treatment: 5 Active Inactive Electronic Signature(s) Signed: 03/02/2022 9:07:36 AM By: Gretta Cool, BSN, RN, CWS, Kim RN, BSN Signed:  06/25/2022 2:34:05 PM By: Carlene Coria RN Previous Signature: 02/02/2022 1:52:28 PM Version By: Carlene Coria RN Entered By: Gretta Cool, BSN, RN, CWS, Kim on 03/02/2022 09:07:36 Kindler, Wynonia Musty ([id number]) -------------------------------------------------------------------------------- Pain Assessment Details Patient Name: Riemann, Rashon H. Date of Service: 01/31/2022 12:30 PM Medical Record Number: [id number] Patient Account Number: [id number] Date of Birth/Sex: 09/02/1927 (88 y.o. F) Treating RN: Carlene Coria Primary Care Casimiro Lienhard: Ramonita Lab Other Clinician: Referring Anna Livers: Ramonita Lab Treating Florencia Zaccaro/Extender: Yaakov Guthrie in Treatment: 5 Active Problems Location of Pain Severity and Description of Pain Patient Has Paino No Site Locations Pain Management and Medication Current Pain Management: Electronic Signature(s) Signed: 02/02/2022 1:52:28 PM By: Carlene Coria RN Entered By: Carlene Coria on 01/31/2022 12:41:51 Haffey, Wynonia Musty ([id number]) -------------------------------------------------------------------------------- Patient/Caregiver Education Details Patient Name: Norva Pavlov. Date of Service: 01/31/2022 12:30 PM Medical Record Number: [id number] Patient Account Number: [id number] Date of Birth/Gender: [birth year] (88 y.o. F) Treating RN: Carlene Coria Primary Care Physician: Ramonita Lab Other Clinician: Referring Physician: Ramonita Lab Treating Physician/Extender: Yaakov Guthrie in Treatment: 5 Education Assessment Education Provided To: Patient Education Topics Provided Wound/Skin Impairment: Methods: Explain/Verbal Responses: State content correctly Electronic Signature(s) Signed: 02/02/2022 1:52:28 PM By: Carlene Coria RN Entered By: Carlene Coria on 01/31/2022 12:56:48 Hilgers, Wynonia Musty ([id number]) -------------------------------------------------------------------------------- Wound Assessment Details Patient Name: Pillay, Jordyne H. Date  of Service: 01/31/2022 12:30 PM Medical Record Number: [id number] Patient Account Number: [id number] Date of Birth/Sex: 07/23/1927 (88 y.o. F) Treating RN: Cornell Barman Primary Care Ayiden Milliman: Ramonita Lab Other Clinician: Referring Kimori Tartaglia: Ramonita Lab Treating Mehul Rudin/Extender: Yaakov Guthrie in Treatment: 5 Wound Status Wound Number: 1 Primary Etiology: Trauma, Other Wound Location: Left, Anterior Lower Leg Wound Status: Healed - Epithelialized Wounding Event: Trauma Comorbid History: Hypertension Date Acquired: 11/23/2021 Weeks Of Treatment: 5 Clustered Wound: Yes Photos Wound Measurements Length: (cm) Width: (cm) Depth: (cm) Area: (cm) Volume: (cm) 0 % Reduction in Area: 99.2% 0 % Reduction in Volume: 99.2% 0 Epithelialization: None 0 Tunneling: No 0 Undermining: No Wound Description Classification: Full Thickness Without Exposed Support Structures Exudate Amount: Medium Exudate Type: Serosanguineous Exudate Color: red, brown Foul Odor After Cleansing: No Slough/Fibrino Yes Wound Bed Granulation Amount: Large (67-100%) Exposed Structure Granulation Quality: Red, Pink Fascia Exposed: No Necrotic Amount: None Present (0%) Fat Layer (Subcutaneous Tissue) Exposed: Yes Tendon Exposed: No Muscle Exposed: No Joint Exposed: No Bone Exposed:  No Treatment Notes Wound #1 (Lower Leg) Wound Laterality: Left, Anterior Cleanser Byram Ancillary Kit - 15 Day Supply Discharge Instruction: Use supplies as instructed; Kit contains: (15) Saline Bullets; (15) 3x3 Gauze; 15 pr Gloves Soap and Water Discharge Instruction: Gently cleanse wound with antibacterial soap, rinse and pat dry prior to dressing wounds Blasko, Erika H. ([id number]) Peri-Wound Care Topical Primary Dressing Xeroform 5x9-HBD (in/in) Discharge Instruction: Apply Xeroform 5x9-HBD (in/in) as directed Secondary Dressing Gauze Discharge Instruction: As directed: dry Kerlix 4.5 x 4.1 (in/yd) Discharge  Instruction: Apply Kerlix 4.5 x 4.1 (in/yd) as instructed Secured With Medipore Tape - 29M Medipore H Soft Cloth Surgical Tape, 2x2 (in/yd) Compression Wrap Compression Stockings Add-Ons Electronic Signature(s) Signed: 03/20/2022 12:11:40 PM By: Gretta Cool, BSN, RN, CWS, Kim RN, BSN Previous Signature: 02/02/2022 1:52:28 PM Version By: Carlene Coria RN Entered By: Gretta Cool, BSN, RN, CWS, Kim on 03/02/2022 09:07:20 Inch, Wynonia Musty ([id number]) -------------------------------------------------------------------------------- [place] Details Patient Name: Hsiao, Myracle H. Date of Service: 01/31/2022 12:30 PM Medical Record Number: [id number] Patient Account Number: [id number] Date of Birth/Sex: 04-27-1927 (88 y.o. F) Treating RN: Carlene Coria Primary Care Nazeer Romney: Ramonita Lab Other Clinician: Referring Bandy Honaker: Ramonita Lab Treating Denetria Luevanos/Extender: Yaakov Guthrie in Treatment: 5 Vital Signs Time Taken: 12:41 Temperature (F): 97.8 Height (in): 61 Pulse (bpm): 76 Weight (lbs): 136 Respiratory Rate (breaths/min): 18 Body Mass Index (BMI): 25.7 Blood Pressure (mmHg): 170/83 Reference Range: 80 - 120 mg / dl Electronic Signature(s) Signed: 02/02/2022 1:52:28 PM By: Carlene Coria RN Entered By: Carlene Coria on 01/31/2022 12:41:41

## 2022-02-07 ENCOUNTER — Ambulatory Visit: Payer: Medicare PPO | Admitting: Internal Medicine

## 2022-02-14 ENCOUNTER — Ambulatory Visit: Payer: Medicare PPO | Admitting: Internal Medicine

## 2022-02-21 ENCOUNTER — Ambulatory Visit: Payer: Medicare PPO | Admitting: Internal Medicine

## 2022-02-28 ENCOUNTER — Ambulatory Visit: Payer: Medicare PPO | Admitting: Internal Medicine

## 2022-03-07 ENCOUNTER — Ambulatory Visit: Payer: Medicare PPO | Admitting: Internal Medicine

## 2022-03-14 ENCOUNTER — Other Ambulatory Visit: Payer: Self-pay | Admitting: Internal Medicine

## 2022-03-14 DIAGNOSIS — R413 Other amnesia: Secondary | ICD-10-CM

## 2022-03-14 DIAGNOSIS — I10 Essential (primary) hypertension: Secondary | ICD-10-CM

## 2022-03-26 ENCOUNTER — Ambulatory Visit
Admission: RE | Admit: 2022-03-26 | Discharge: 2022-03-26 | Disposition: A | Discharge status: Home / Self Care | Payer: Medicare PPO | Source: Ambulatory Visit | Attending: Internal Medicine | Admitting: Internal Medicine

## 2022-03-26 DIAGNOSIS — R413 Other amnesia: Secondary | ICD-10-CM

## 2022-03-26 DIAGNOSIS — I10 Essential (primary) hypertension: Secondary | ICD-10-CM

## 2022-09-29 ENCOUNTER — Emergency Department
Admission: EM | Admit: 2022-09-29 | Discharge: 2022-09-29 | Discharge status: Against Medical Advice | Payer: Medicare PPO | Attending: Emergency Medicine | Admitting: Emergency Medicine

## 2022-09-29 ENCOUNTER — Emergency Department: Payer: Medicare PPO

## 2022-09-29 DIAGNOSIS — Z7901 Long term (current) use of anticoagulants: Secondary | ICD-10-CM | POA: Insufficient documentation

## 2022-09-29 DIAGNOSIS — R0602 Shortness of breath: Secondary | ICD-10-CM | POA: Diagnosis present

## 2022-09-29 DIAGNOSIS — Z5321 Procedure and treatment not carried out due to patient leaving prior to being seen by health care provider: Secondary | ICD-10-CM | POA: Insufficient documentation

## 2022-09-29 DIAGNOSIS — I4891 Unspecified atrial fibrillation: Secondary | ICD-10-CM | POA: Diagnosis not present

## 2022-09-29 DIAGNOSIS — Z1152 Encounter for screening for COVID-19: Secondary | ICD-10-CM | POA: Diagnosis not present

## 2022-09-29 DIAGNOSIS — E039 Hypothyroidism, unspecified: Secondary | ICD-10-CM | POA: Diagnosis not present

## 2022-09-29 LAB — BASIC METABOLIC PANEL
Anion gap: 8 (ref 5–15)
BUN: 19 mg/dL (ref 8–23)
CO2: 29 mmol/L (ref 22–32)
Calcium: 9.1 mg/dL (ref 8.9–10.3)
Chloride: 103 mmol/L (ref 98–111)
Creatinine, Ser: 1.01 mg/dL — ABNORMAL HIGH (ref 0.44–1.00)
GFR, Estimated: 52 mL/min — ABNORMAL LOW (ref >60)
Glucose, Bld: 131 mg/dL — ABNORMAL HIGH (ref 70–99)
Potassium: 3.7 mmol/L (ref 3.5–5.1)
Sodium: 140 mmol/L (ref 135–145)

## 2022-09-29 LAB — CBC
HCT: 40.8 % (ref 36.0–46.0)
Hemoglobin: 13.4 g/dL (ref 12.0–15.0)
MCH: 30.1 pg (ref 26.0–34.0)
MCHC: 32.8 g/dL (ref 30.0–36.0)
MCV: 91.7 fL (ref 80.0–100.0)
Platelets: 267 10*3/uL (ref 150–400)
RBC: 4.45 MIL/uL (ref 3.87–5.11)
RDW: 13.7 % (ref 11.5–15.5)
WBC: 8.1 10*3/uL (ref 4.0–10.5)
nRBC: 0 % (ref 0.0–0.2)

## 2022-09-29 LAB — RESP PANEL BY RT-PCR (RSV, FLU A&B, COVID)  RVPGX2
Influenza A by PCR: NEGATIVE
Influenza B by PCR: NEGATIVE
Resp Syncytial Virus by PCR: NEGATIVE
SARS Coronavirus 2 by RT PCR: NEGATIVE

## 2022-09-29 NOTE — ED Notes (Addendum)
Daughter at STAT desk stating inquiring about the wait. Explained that there still was not any rooms available. Encouraged pt to stay but states they wanted would like to leave.

## 2022-09-29 NOTE — ED Provider Triage Note (Signed)
Emergency Medicine Provider Triage Evaluation Note  Mackenzie [name], a 88 y.o. female  was evaluated in triage.  Pt complains of SOB.  With a history of A-fib on Eliquis, hypothyroidism, CVA, and IBS presents to the ED accompanied by her adult daughter.  Review of Systems  Positive: SOB Negative: FCS  Physical Exam  There were no vitals taken for this visit. Gen:   Awake, no distress  NAD Resp:  Normal effort  MSK:   Moves extremities without difficulty  Other:    Medical Decision Making  Medically screening exam initiated at 12:28 PM.  Appropriate orders placed.  Mackenzie [name] was informed that the remainder of the evaluation will be completed by another provider, this initial triage assessment does not replace that evaluation, and the importance of remaining in the ED until their evaluation is complete.  Geriatric patient to the ED for evaluation of progressive shortness of breath over the last few weeks.   [name], PA-C 09/29/22 1229

## 2022-09-29 NOTE — ED Triage Notes (Signed)
Pt presents via POV from [facility] s/o progressive SOB over the last few weeks. Reports waiting to see pulmonologist. Pt reports increasing SOB with pain to "lungs and abd". Denies cough/congestion. Denies N/V/D.

## 2023-02-04 ENCOUNTER — Ambulatory Visit: Payer: Medicare PPO | Admitting: Obstetrics & Gynecology

## 2023-02-04 ENCOUNTER — Encounter: Payer: Self-pay | Admitting: Obstetrics & Gynecology

## 2023-02-04 VITALS — BP 149/70 | Ht 61.0 in | Wt 138.0 lb

## 2023-02-04 DIAGNOSIS — N816 Rectocele: Secondary | ICD-10-CM

## 2023-02-04 NOTE — Progress Notes (Signed)
    GYNECOLOGY PROGRESS NOTE  Subjective:    Patient ID: Mackenzie [name], female    DOB: [birth year], 89 y.o.   MRN: [id number]  HPI  Patient is a 89 y.o. widowed G17P2012 female who presents for a 1 month finding of a "mass" in her vagina. She noted this when wiping. She has chronic constipation, denies having to do splinting or urinary incontinence. She lives at [facility] (assisted living). She is not sexually active.    Review of Systems Pertinent items are noted in HPI.   Objective:   Blood pressure (!) 149/70, height 5\' 1"  (1.549 m), weight 138 lb (62.6 kg). Body mass index is 26.07 kg/m. Well nourished, well hydrated White female, no apparent distress She is ambulating with a 4 point walker and conversing normally. Her daughter is with her today for the encounter.  EG- marked atrophy Labia majora have grown over the clitorus Grade 4 rectocele noted, No cystocele Pederson spec placed, no uterine prolapse Bimanual exam reveals no masses or tenderness  Assessment:   Grade 4 rectocele  Plan:  We discussed her options of watchful waiting, pessary, surgery. At this time, she prefers watchful waiting.

## 2023-07-29 ENCOUNTER — Other Ambulatory Visit
Admission: RE | Admit: 2023-07-29 | Discharge: 2023-07-29 | Disposition: A | Discharge status: Home / Self Care | Payer: Medicare PPO | Source: Ambulatory Visit | Attending: Internal Medicine | Admitting: Internal Medicine

## 2023-07-29 DIAGNOSIS — I509 Heart failure, unspecified: Secondary | ICD-10-CM | POA: Diagnosis present

## 2023-07-29 LAB — BRAIN NATRIURETIC PEPTIDE: B Natriuretic Peptide: 437.2 pg/mL — ABNORMAL HIGH (ref 0.0–100.0)

## 2024-04-06 IMAGING — CT CT HEAD W/O CM
2 series · 15 of 30 positions shown, 17 images · non-contrast
Comparison: 02/15/2017, 05/02/2017.

CLINICAL DATA: Memory changes.



[Series 2: head without · axial · non-contrast · 0.39mm/px · z∈[+24,+144]mm · 7 of 32 slices shown, 9 images]
[im 4/32  brain]
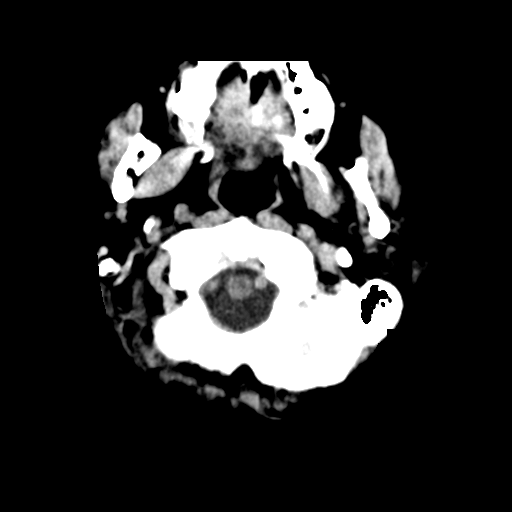
[im 4/32  bone]
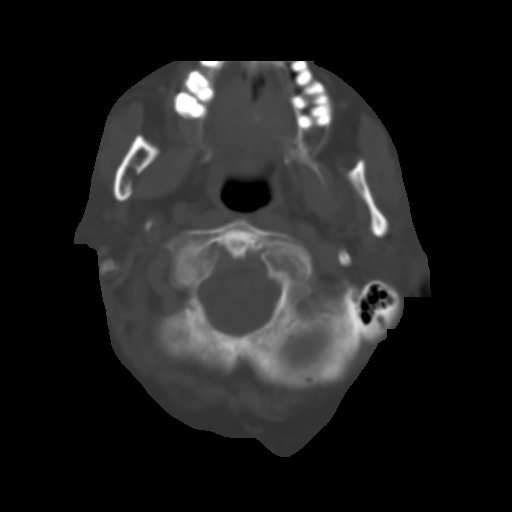
[im 8/32  brain]
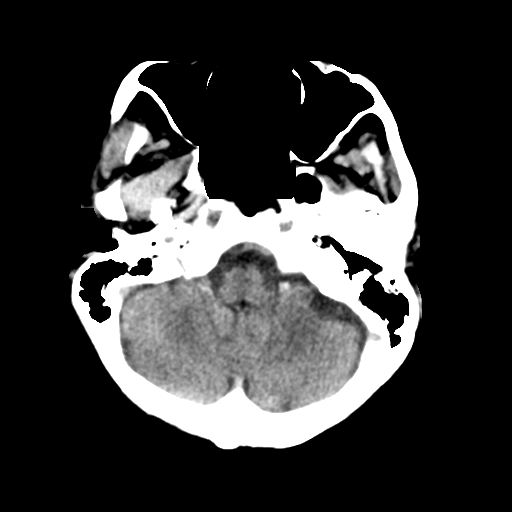
[im 12/32  brain]
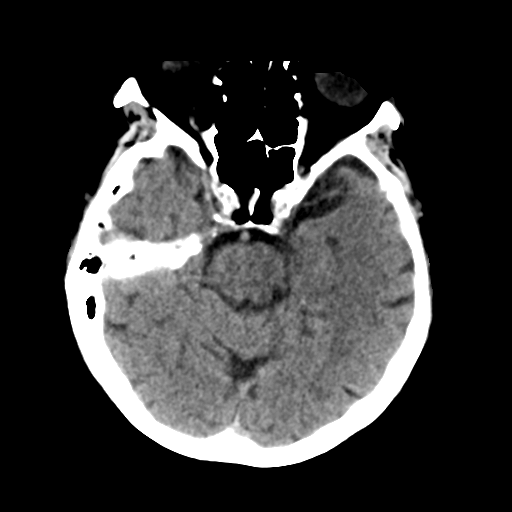
[im 16/32  brain]
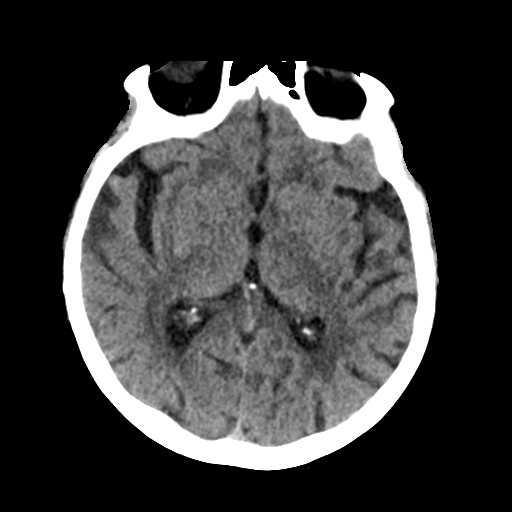
[im 20/32  brain]
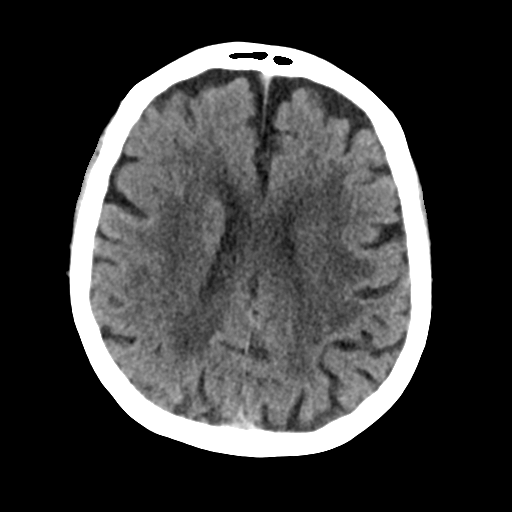
[im 20/32  bone]
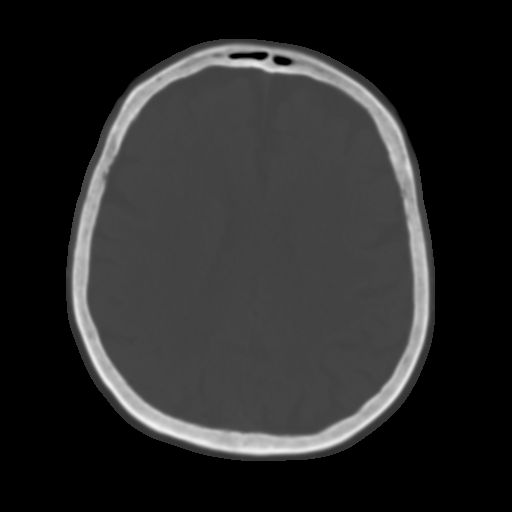
[im 24/32  brain]
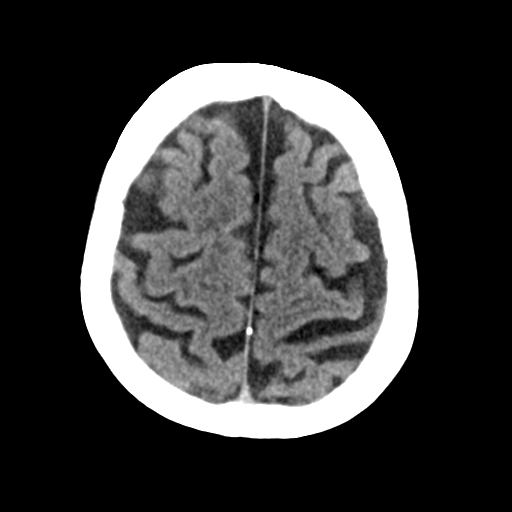
[im 28/32  brain]
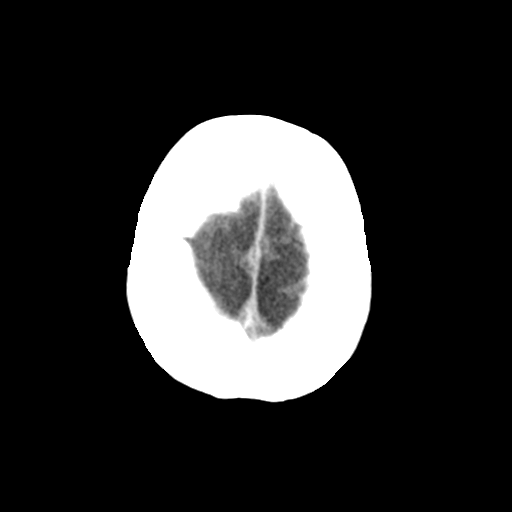

[Series 3: head bone · axial · 0.39mm/px · z∈[+24,+152]mm · 8 of 80 slices shown]
[im 8/80  bone]
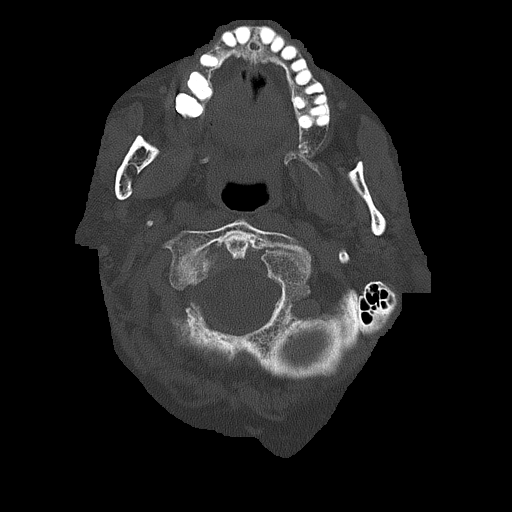
[im 16/80  bone]
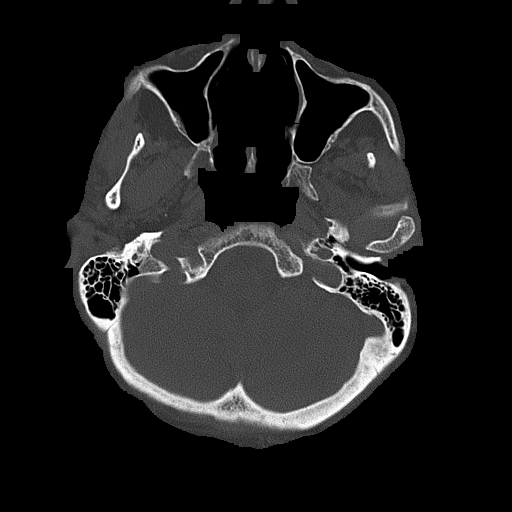
[im 24/80  bone]
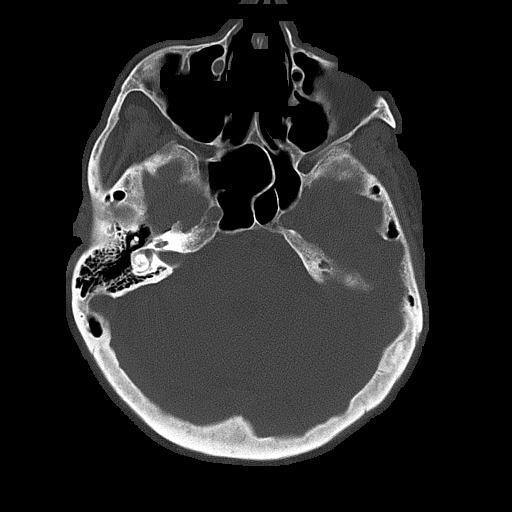
[im 36/80  bone]
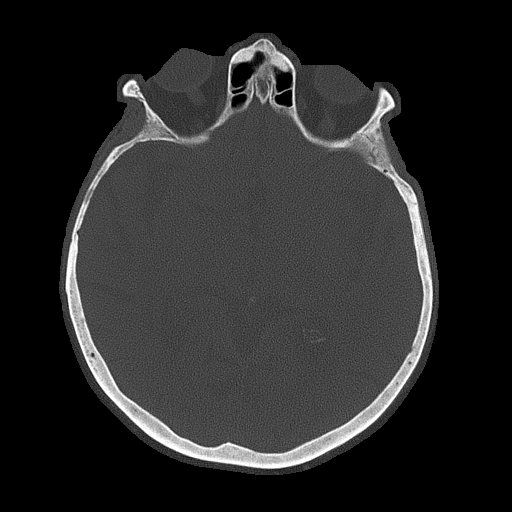
[im 44/80  bone]
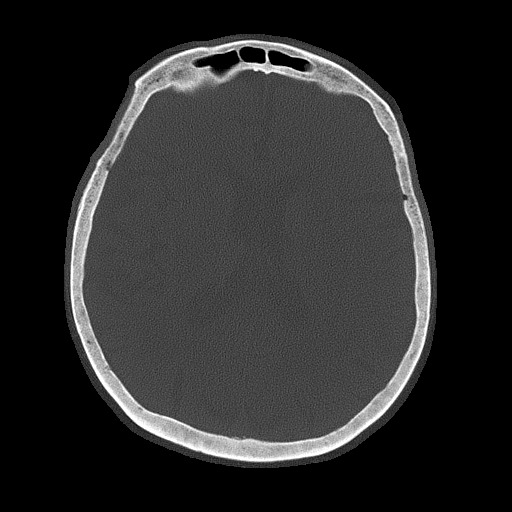
[im 56/80  bone]
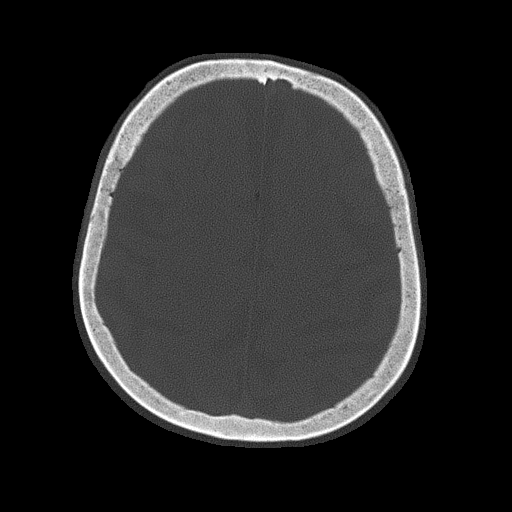
[im 64/80  bone]
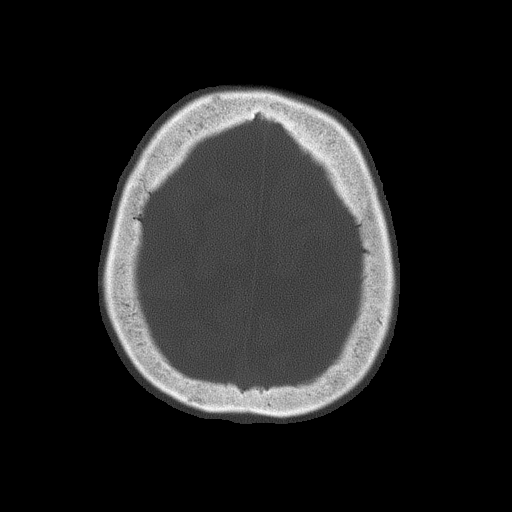
[im 72/80  bone]
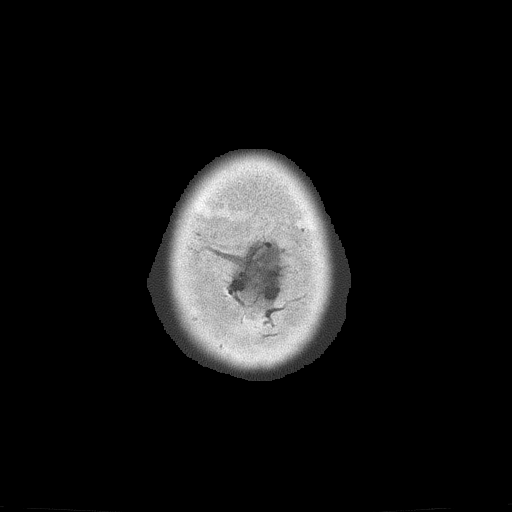

[15 of 30 positions shown; findings below may reference images not displayed]

FINDINGS: Brain: No acute hemorrhage, midline shift or mass effect. No
extra-axial fluid collection. Generalized atrophy is noted.
Extensive periventricular and subcortical white matter hypodensities
are noted bilaterally. No hydrocephalus.

Vascular: No hyperdense vessel or unexpected calcification.

Skull: Normal. Negative for fracture or focal lesion.

Sinuses/Orbits: No acute finding.

Other: None.
IMPRESSION: 1. No acute intracranial process.
2. Atrophy with chronic microvascular ischemic changes.

## 2024-04-15 ENCOUNTER — Other Ambulatory Visit: Payer: Self-pay | Admitting: Nephrology

## 2024-04-15 DIAGNOSIS — D631 Anemia in chronic kidney disease: Secondary | ICD-10-CM

## 2024-04-15 DIAGNOSIS — N184 Chronic kidney disease, stage 4 (severe): Secondary | ICD-10-CM

## 2024-04-15 DIAGNOSIS — N029 Recurrent and persistent hematuria with unspecified morphologic changes: Secondary | ICD-10-CM

## 2024-04-20 ENCOUNTER — Ambulatory Visit
Admission: RE | Admit: 2024-04-20 | Discharge: 2024-04-20 | Disposition: A | Discharge status: Home / Self Care | Source: Ambulatory Visit | Attending: Nephrology | Admitting: Nephrology

## 2024-04-20 DIAGNOSIS — N184 Chronic kidney disease, stage 4 (severe): Secondary | ICD-10-CM | POA: Insufficient documentation

## 2024-04-20 DIAGNOSIS — N029 Recurrent and persistent hematuria with unspecified morphologic changes: Secondary | ICD-10-CM | POA: Insufficient documentation

## 2024-04-20 DIAGNOSIS — D631 Anemia in chronic kidney disease: Secondary | ICD-10-CM | POA: Insufficient documentation

## 2024-09-17 ENCOUNTER — Ambulatory Visit

## 2024-09-17 DIAGNOSIS — G609 Hereditary and idiopathic neuropathy, unspecified: Secondary | ICD-10-CM | POA: Diagnosis not present

## 2024-09-17 DIAGNOSIS — L603 Nail dystrophy: Secondary | ICD-10-CM | POA: Diagnosis not present

## 2024-09-17 NOTE — Progress Notes (Signed)
° °  °  Subjective:  Patient ID: Mackenzie [name], female    DOB: 1927-08-11,  MRN: [id number]  89 y.o. female presents accompanied by her daughter with chief concern of elongated, thickened, painful, discolored toenails for months. Patient has tried self attempt at trimming toenail. The patient is on eliquis, and her daughter's do not feel comfortable trimming her toenails. She is not diabetic but does relate to neuropathy.   Chief Complaint  Patient presents with   Nail Problem    NP- (on blood thinner) RFC ( no other issues at this time)     PCP is Fernande Ophelia JINNY DOUGLAS, MD  Allergies[1]  Review of Systems: Negative except as noted in the HPI.   Objective:  Mackenzie [name] is a pleasant 89 y.o. female in NAD. AAO x 3.  Vascular Examination: Vascular status intact b/l with palpable pedal pulses. CFT immediate b/l. Pedal hair present. No edema. No pain with calf compression b/l. Skin temperature gradient WNL b/l. No varicosities noted. No cyanosis or clubbing noted.  Neurological Examination: Sensation grossly intact b/l with 10 gram monofilament. Negative tinel sign at tarsal tunnel bilaterally.   Dermatological Examination: Pedal skin with normal turgor, texture and tone b/l. No open wounds nor interdigital macerations noted. Toenails 1-5 b/l thick, discolored, elongated with subungual debris and pain on dorsal palpation. No hyperkeratotic lesions noted b/l.   Musculoskeletal Examination: Muscle strength 5/5 to b/l LE.  No pain, crepitus noted b/l. No gross pedal deformities. Patient ambulates independently without assistive aids.   Radiographs: None Last A1c:       No data to display           Assessment:   1. Nail dystrophy   2. Idiopathic peripheral neuropathy     Plan:  Consent given for treatment. Patient examined. All patient's and/or POA's questions/concerns addressed on today's visit. Toenails 1-5 b/l debrided in length and girth without incident. Continue soft,  supportive shoe gear daily. Report any pedal injuries to medical professional. Call office if there are any questions/concerns. -Patient/POA to call should there be question/concern in the interim.  RTC 3 months with Dr. Gaynel, DPM  Prentice Ovens, DPM AACFAS Fellowship Trained Podiatric Surgeon [facility]       [place] LOCATION: [address] [place] [address]                   Office [phone]   [facility] LOCATION: [address] [place], [place] [address] Office [phone]     [1] No Known Allergies
